# Patient Record
Sex: Female | Born: 1980 | Race: Black or African American | Hispanic: No | Marital: Married | Smoking: Former smoker
Health system: Southern US, Community
[De-identification: ages and names within clinical notes are randomized; demographics above are authoritative.]

## PROBLEM LIST (undated history)

## (undated) DIAGNOSIS — J40 Bronchitis, not specified as acute or chronic: Secondary | ICD-10-CM

## (undated) HISTORY — PX: ECTOPIC PREGNANCY SURGERY: SHX613

---

## 1999-08-12 ENCOUNTER — Emergency Department (HOSPITAL_COMMUNITY): Admission: EM | Admit: 1999-08-12 | Discharge: 1999-08-12 | Payer: Self-pay | Admitting: *Deleted

## 1999-08-12 ENCOUNTER — Encounter: Payer: Self-pay | Admitting: *Deleted

## 2000-06-28 ENCOUNTER — Emergency Department (HOSPITAL_COMMUNITY): Admission: EM | Admit: 2000-06-28 | Discharge: 2000-06-28 | Payer: Self-pay | Admitting: Emergency Medicine

## 2003-02-02 ENCOUNTER — Emergency Department (HOSPITAL_COMMUNITY): Admission: EM | Admit: 2003-02-02 | Discharge: 2003-02-02 | Payer: Self-pay | Admitting: Emergency Medicine

## 2003-03-03 ENCOUNTER — Encounter: Admission: RE | Admit: 2003-03-03 | Discharge: 2003-03-03 | Payer: Self-pay | Admitting: *Deleted

## 2003-03-05 ENCOUNTER — Ambulatory Visit (HOSPITAL_COMMUNITY): Admission: RE | Admit: 2003-03-05 | Discharge: 2003-03-05 | Payer: Self-pay | Admitting: *Deleted

## 2003-03-29 ENCOUNTER — Inpatient Hospital Stay (HOSPITAL_COMMUNITY): Admission: AD | Admit: 2003-03-29 | Discharge: 2003-03-29 | Payer: Self-pay | Admitting: Obstetrics and Gynecology

## 2003-04-08 ENCOUNTER — Inpatient Hospital Stay (HOSPITAL_COMMUNITY): Admission: AD | Admit: 2003-04-08 | Discharge: 2003-04-10 | Payer: Self-pay | Admitting: Family Medicine

## 2003-04-13 ENCOUNTER — Emergency Department (HOSPITAL_COMMUNITY): Admission: EM | Admit: 2003-04-13 | Discharge: 2003-04-14 | Payer: Self-pay | Admitting: *Deleted

## 2007-06-01 ENCOUNTER — Emergency Department (HOSPITAL_COMMUNITY): Admission: EM | Admit: 2007-06-01 | Discharge: 2007-06-01 | Payer: Self-pay | Admitting: Emergency Medicine

## 2009-01-14 ENCOUNTER — Emergency Department (HOSPITAL_COMMUNITY): Admission: EM | Admit: 2009-01-14 | Discharge: 2009-01-14 | Payer: Self-pay | Admitting: Emergency Medicine

## 2009-01-17 ENCOUNTER — Emergency Department (HOSPITAL_COMMUNITY): Admission: EM | Admit: 2009-01-17 | Discharge: 2009-01-17 | Payer: Self-pay | Admitting: Emergency Medicine

## 2009-05-02 ENCOUNTER — Ambulatory Visit: Payer: Self-pay | Admitting: Obstetrics & Gynecology

## 2009-05-02 ENCOUNTER — Inpatient Hospital Stay (HOSPITAL_COMMUNITY): Admission: AD | Admit: 2009-05-02 | Discharge: 2009-05-04 | Payer: Self-pay | Admitting: Obstetrics & Gynecology

## 2009-06-01 ENCOUNTER — Inpatient Hospital Stay (HOSPITAL_COMMUNITY): Admission: AD | Admit: 2009-06-01 | Discharge: 2009-06-01 | Payer: Self-pay | Admitting: Obstetrics

## 2009-08-12 ENCOUNTER — Ambulatory Visit (HOSPITAL_COMMUNITY): Admission: RE | Admit: 2009-08-12 | Discharge: 2009-08-12 | Payer: Self-pay | Admitting: Obstetrics

## 2009-11-07 ENCOUNTER — Inpatient Hospital Stay (HOSPITAL_COMMUNITY): Admission: AD | Admit: 2009-11-07 | Discharge: 2009-11-07 | Payer: Self-pay | Admitting: Obstetrics

## 2009-11-22 ENCOUNTER — Inpatient Hospital Stay (HOSPITAL_COMMUNITY): Admission: AD | Admit: 2009-11-22 | Discharge: 2009-11-22 | Payer: Self-pay | Admitting: Obstetrics

## 2009-11-25 ENCOUNTER — Inpatient Hospital Stay (HOSPITAL_COMMUNITY): Admission: AD | Admit: 2009-11-25 | Discharge: 2009-11-27 | Payer: Self-pay | Admitting: Obstetrics

## 2009-11-25 ENCOUNTER — Encounter (INDEPENDENT_AMBULATORY_CARE_PROVIDER_SITE_OTHER): Payer: Self-pay | Admitting: Obstetrics

## 2011-03-20 LAB — RPR: RPR Ser Ql: NONREACTIVE

## 2011-03-20 LAB — CBC
HCT: 31.4 % — ABNORMAL LOW (ref 36.0–46.0)
Hemoglobin: 8.8 g/dL — ABNORMAL LOW (ref 12.0–15.0)
MCHC: 31.2 g/dL (ref 30.0–36.0)
MCHC: 31.8 g/dL (ref 30.0–36.0)
MCV: 77.3 fL — ABNORMAL LOW (ref 78.0–100.0)
Platelets: 233 10*3/uL (ref 150–400)
Platelets: 261 10*3/uL (ref 150–400)
RDW: 16.7 % — ABNORMAL HIGH (ref 11.5–15.5)

## 2011-03-27 LAB — GC/CHLAMYDIA PROBE AMP, GENITAL
Chlamydia, DNA Probe: NEGATIVE
GC Probe Amp, Genital: NEGATIVE

## 2011-03-27 LAB — URINALYSIS, MICROSCOPIC ONLY
Bilirubin Urine: NEGATIVE
Glucose, UA: NEGATIVE mg/dL
Ketones, ur: NEGATIVE mg/dL
Nitrite: NEGATIVE
Protein, ur: NEGATIVE mg/dL
pH: 7 (ref 5.0–8.0)

## 2011-03-27 LAB — URINE MICROSCOPIC-ADD ON

## 2011-03-27 LAB — COMPREHENSIVE METABOLIC PANEL
ALT: 25 U/L (ref 0–35)
ALT: 42 U/L — ABNORMAL HIGH (ref 0–35)
AST: 47 U/L — ABNORMAL HIGH (ref 0–37)
Albumin: 3.9 g/dL (ref 3.5–5.2)
Alkaline Phosphatase: 37 U/L — ABNORMAL LOW (ref 39–117)
Alkaline Phosphatase: 54 U/L (ref 39–117)
CO2: 25 mEq/L (ref 19–32)
Chloride: 107 mEq/L (ref 96–112)
Chloride: 99 mEq/L (ref 96–112)
GFR calc Af Amer: >60 mL/min (ref >60)
GFR calc non Af Amer: >60 mL/min (ref >60)
Glucose, Bld: 70 mg/dL (ref 70–99)
Potassium: 3 mEq/L — ABNORMAL LOW (ref 3.5–5.1)
Potassium: 3.2 mEq/L — ABNORMAL LOW (ref 3.5–5.1)
Sodium: 135 mEq/L (ref 135–145)
Total Bilirubin: 0.6 mg/dL (ref 0.3–1.2)
Total Bilirubin: 0.6 mg/dL (ref 0.3–1.2)
Total Protein: 5.1 g/dL — ABNORMAL LOW (ref 6.0–8.3)

## 2011-03-27 LAB — TSH: TSH: 0.123 u[IU]/mL — ABNORMAL LOW (ref 0.350–4.500)

## 2011-03-27 LAB — URINALYSIS, ROUTINE W REFLEX MICROSCOPIC
Nitrite: NEGATIVE
Protein, ur: 100 mg/dL — AB
Specific Gravity, Urine: 1.02 (ref 1.005–1.030)
Urobilinogen, UA: 4 mg/dL — ABNORMAL HIGH (ref 0.0–1.0)

## 2011-03-27 LAB — CBC
Hemoglobin: 11.6 g/dL — ABNORMAL LOW (ref 12.0–15.0)
MCHC: 34.2 g/dL (ref 30.0–36.0)
RBC: 4 MIL/uL (ref 3.87–5.11)
WBC: 7.1 10*3/uL (ref 4.0–10.5)

## 2011-03-27 LAB — DIFFERENTIAL
Basophils Relative: 0 % (ref 0–1)
Lymphocytes Relative: 26 % (ref 12–46)
Lymphs Abs: 1.8 10*3/uL (ref 0.7–4.0)
Monocytes Absolute: 0.8 10*3/uL (ref 0.1–1.0)
Monocytes Relative: 11 % (ref 3–12)
Neutro Abs: 4.4 10*3/uL (ref 1.7–7.7)
Neutrophils Relative %: 63 % (ref 43–77)

## 2011-03-27 LAB — T3 UPTAKE: T3 Uptake Ratio: 29 % (ref 22.5–37.0)

## 2011-03-27 LAB — AMYLASE: Amylase: 169 U/L — ABNORMAL HIGH (ref 27–131)

## 2011-03-27 LAB — WET PREP, GENITAL

## 2011-03-27 LAB — T4, FREE: Free T4: 0.9 ng/dL (ref 0.80–1.80)

## 2011-04-02 LAB — URINE MICROSCOPIC-ADD ON

## 2011-04-02 LAB — CBC
RBC: 4.95 MIL/uL (ref 3.87–5.11)
WBC: 6.6 10*3/uL (ref 4.0–10.5)

## 2011-04-02 LAB — DIFFERENTIAL
Basophils Relative: 0 % (ref 0–1)
Lymphocytes Relative: 26 % (ref 12–46)
Lymphs Abs: 1.7 10*3/uL (ref 0.7–4.0)
Monocytes Relative: 13 % — ABNORMAL HIGH (ref 3–12)
Neutro Abs: 4 10*3/uL (ref 1.7–7.7)
Neutrophils Relative %: 61 % (ref 43–77)

## 2011-04-02 LAB — HEPATIC FUNCTION PANEL
AST: 43 U/L — ABNORMAL HIGH (ref 0–37)
Albumin: 4.2 g/dL (ref 3.5–5.2)
Alkaline Phosphatase: 56 U/L (ref 39–117)
Total Bilirubin: 1.2 mg/dL (ref 0.3–1.2)

## 2011-04-02 LAB — BASIC METABOLIC PANEL
Calcium: 9.4 mg/dL (ref 8.4–10.5)
Creatinine, Ser: 0.73 mg/dL (ref 0.4–1.2)
GFR calc Af Amer: >60 mL/min (ref >60)
GFR calc non Af Amer: >60 mL/min (ref >60)

## 2011-04-02 LAB — LIPASE, BLOOD: Lipase: 30 U/L (ref 11–59)

## 2011-04-02 LAB — URINALYSIS, ROUTINE W REFLEX MICROSCOPIC
Bilirubin Urine: NEGATIVE
Specific Gravity, Urine: 1.027 (ref 1.005–1.030)
Urobilinogen, UA: 1 mg/dL (ref 0.0–1.0)

## 2011-04-02 LAB — POCT PREGNANCY, URINE: Preg Test, Ur: NEGATIVE

## 2011-04-03 LAB — DIFFERENTIAL
Lymphocytes Relative: 12 % (ref 12–46)
Lymphs Abs: 0.8 10*3/uL (ref 0.7–4.0)
Neutrophils Relative %: 83 % — ABNORMAL HIGH (ref 43–77)

## 2011-04-03 LAB — POCT I-STAT, CHEM 8
Calcium, Ion: 1.07 mmol/L — ABNORMAL LOW (ref 1.12–1.32)
Chloride: 103 mEq/L (ref 96–112)
Glucose, Bld: 110 mg/dL — ABNORMAL HIGH (ref 70–99)
HCT: 43 % (ref 36.0–46.0)
Hemoglobin: 14.6 g/dL (ref 12.0–15.0)

## 2011-04-03 LAB — COMPREHENSIVE METABOLIC PANEL
ALT: 22 U/L (ref 0–35)
AST: 39 U/L — ABNORMAL HIGH (ref 0–37)
CO2: 24 mEq/L (ref 19–32)
Chloride: 104 mEq/L (ref 96–112)
GFR calc Af Amer: >60 mL/min (ref >60)
GFR calc non Af Amer: >60 mL/min (ref >60)
Potassium: 3.3 mEq/L — ABNORMAL LOW (ref 3.5–5.1)
Sodium: 136 mEq/L (ref 135–145)
Total Bilirubin: 0.7 mg/dL (ref 0.3–1.2)

## 2011-04-03 LAB — URINALYSIS, ROUTINE W REFLEX MICROSCOPIC
Bilirubin Urine: NEGATIVE
Hgb urine dipstick: NEGATIVE
Ketones, ur: NEGATIVE mg/dL
Nitrite: NEGATIVE
pH: 8 (ref 5.0–8.0)

## 2011-04-03 LAB — CBC
HCT: 39.4 % (ref 36.0–46.0)
Platelets: 165 10*3/uL (ref 150–400)
WBC: 6.3 10*3/uL (ref 4.0–10.5)

## 2011-04-03 LAB — POCT PREGNANCY, URINE: Preg Test, Ur: NEGATIVE

## 2011-05-01 NOTE — Discharge Summary (Signed)
NAMEYING, BLANKENHORN NO.:  [id number]   MEDICAL RECORD NO.:  [id number]          PATIENT TYPE:  INP   LOCATION:  9307                          FACILITY:  [facility]   PHYSICIAN:  Scheryl Darter, MD       DATE OF BIRTH:  April 06, 1981   DATE OF ADMISSION:  05/02/2009  DATE OF DISCHARGE:  05/04/2009                               DISCHARGE SUMMARY   DIAGNOSIS:  Intrauterine pregnancy at [redacted] weeks gestation with hyperemesis  gravidarum.   The patient is a 30 year old gravida 6, para 3, abortus 2, living 3,  last menstrual period February 24, 2009, presented at 9 weeks 4 days  gestation complaining of persistent nausea, vomiting, and abdominal  pain.   PAST MEDICAL HISTORY:  None.   PAST SURGICAL HISTORY:  Cesarean section.   PAST OB/GYN HISTORY:  Cesarean section and ectopic pregnancy.   SOCIAL HISTORY:  The patient denies alcohol or drug use.  She is a  current smoker, tobacco, and also marijuana.   ALLERGIES:  ERYTHROMYCIN.   MEDICATIONS:  None.   PHYSICAL EXAMINATION:  VITAL SIGNS:  Blood pressure 130/88, pulse 66,  temperature 97.7, and respirations 16.  ABDOMEN:  Nontender.   Her amylase was 169.  White count 7.1, hemoglobin 11.6, and platelet  count 195,000.   The patient was admitted with diagnosis of hyperemesis gravidarum.  She  received IV antiemetic therapy.  She received Reglan 10 mg IV q.6 h.,  Phenergan 12.5 mg IV q.3 h. p.r.n. nausea, and Zofran 8 mg IV q.8 h.  p.r.n. nausea.  She got improvement in her symptoms and on May 04, 2009,  she was tolerating p.o. fluids and food.  Repeat amylase was 168.  She  was discharged home with prescription for Pepcid 40 mg a day, Zofran 4  mg p.o. q.6-8 h. p.r.n. nausea, and Reglan 10 mg q.i.d. with meals and  at bedtime.  She was instructed to follow up at [facility].      Scheryl Darter, MD  Electronically Signed     JA/MEDQ  D:  05/20/2009  T:  05/21/2009  Job:  [id number]

## 2011-10-03 LAB — URINE MICROSCOPIC-ADD ON

## 2011-10-03 LAB — I-STAT 8, (EC8 V) (CONVERTED LAB)
BUN: 8
Bicarbonate: 22.5
Glucose, Bld: 168 — ABNORMAL HIGH
Sodium: 139
TCO2: 24
pCO2, Ven: 43.2 — ABNORMAL LOW
pH, Ven: 7.325 — ABNORMAL HIGH

## 2011-10-03 LAB — URINALYSIS, ROUTINE W REFLEX MICROSCOPIC
Glucose, UA: NEGATIVE
Ketones, ur: 40 — AB
Leukocytes, UA: NEGATIVE
Specific Gravity, Urine: 1.024
pH: 6

## 2011-10-03 LAB — POCT I-STAT CREATININE: Creatinine, Ser: 0.7

## 2012-02-21 ENCOUNTER — Encounter (HOSPITAL_COMMUNITY): Payer: Self-pay | Admitting: Emergency Medicine

## 2012-02-21 ENCOUNTER — Observation Stay (HOSPITAL_COMMUNITY)
Admission: EM | Admit: 2012-02-21 | Discharge: 2012-02-22 | Disposition: A | Discharge status: Home / Self Care | Payer: Medicaid Other | Attending: Emergency Medicine | Admitting: Emergency Medicine

## 2012-02-21 DIAGNOSIS — R197 Diarrhea, unspecified: Secondary | ICD-10-CM | POA: Insufficient documentation

## 2012-02-21 DIAGNOSIS — IMO0001 Reserved for inherently not codable concepts without codable children: Secondary | ICD-10-CM | POA: Insufficient documentation

## 2012-02-21 DIAGNOSIS — N390 Urinary tract infection, site not specified: Principal | ICD-10-CM | POA: Insufficient documentation

## 2012-02-21 DIAGNOSIS — R112 Nausea with vomiting, unspecified: Secondary | ICD-10-CM | POA: Insufficient documentation

## 2012-02-21 LAB — BASIC METABOLIC PANEL
CO2: 23 mEq/L (ref 19–32)
Chloride: 101 mEq/L (ref 96–112)
Creatinine, Ser: 0.65 mg/dL (ref 0.50–1.10)
GFR calc Af Amer: >90 mL/min (ref >90)
Potassium: 3.4 mEq/L — ABNORMAL LOW (ref 3.5–5.1)

## 2012-02-21 LAB — CBC
HCT: 40.8 % (ref 36.0–46.0)
Hemoglobin: 13.3 g/dL (ref 12.0–15.0)
MCHC: 32.6 g/dL (ref 30.0–36.0)
WBC: 8 10*3/uL (ref 4.0–10.5)

## 2012-02-21 LAB — DIFFERENTIAL
Basophils Absolute: 0 10*3/uL (ref 0.0–0.1)
Basophils Relative: 0 % (ref 0–1)
Lymphocytes Relative: 20 % (ref 12–46)
Monocytes Absolute: 0.7 10*3/uL (ref 0.1–1.0)
Monocytes Relative: 9 % (ref 3–12)
Neutro Abs: 5.7 10*3/uL (ref 1.7–7.7)
Neutrophils Relative %: 71 % (ref 43–77)

## 2012-02-21 LAB — LIPASE, BLOOD: Lipase: 53 U/L (ref 11–59)

## 2012-02-21 MED ORDER — ONDANSETRON HCL 4 MG/2ML IJ SOLN
4.0000 mg | Freq: Four times a day (QID) | INTRAMUSCULAR | Status: DC | PRN
  Administered 2012-02-21: 4 mg via INTRAVENOUS
  Filled 2012-02-21: qty 2

## 2012-02-21 MED ORDER — SODIUM CHLORIDE 0.9 % IV SOLN
INTRAVENOUS | Status: DC
  Administered 2012-02-22: 04:00:00 via INTRAVENOUS

## 2012-02-21 MED ORDER — PROMETHAZINE HCL 25 MG/ML IJ SOLN
25.0000 mg | INTRAMUSCULAR | Status: AC
  Administered 2012-02-22: 25 mg via INTRAVENOUS
  Filled 2012-02-21: qty 1

## 2012-02-21 MED ORDER — SODIUM CHLORIDE 0.9 % IV BOLUS (SEPSIS)
1000.0000 mL | Freq: Once | INTRAVENOUS | Status: AC
  Administered 2012-02-21: 1000 mL via INTRAVENOUS

## 2012-02-21 NOTE — ED Provider Notes (Signed)
History     CSN: [id number]  Arrival date & time 02/21/12  2249   First MD Initiated Contact with Patient 02/21/12 2319      Chief Complaint  Patient presents with  . Emesis    (Consider location/radiation/quality/duration/timing/severity/associated sxs/prior treatment) Patient is a 31 y.o. female presenting with vomiting. The history is provided by the patient.  Emesis  This is a new problem. The current episode started 12 to 24 hours ago. The problem occurs 5 to 10 times per day. The problem has not changed since onset.The emesis has an appearance of stomach contents. There has been no fever. Associated symptoms include diarrhea and myalgias. Pertinent negatives include no abdominal pain, no chills, no cough, no fever, no headaches, no sweats and no URI. Risk factors: 3 children at home all with the same symptoms of n/v/d and all diagnosed with a virus.   moderate in severity, no blood in emesis or stools, diarrhea resolved, still unable to tolerate anything by mouth. Worse with anything she tries to eat or drink, no known alleviating factors  History reviewed. No pertinent past medical history.  Past Surgical History  Procedure Date  . Ectopic pregnancy surgery     No family history on file.  History  Substance Use Topics  . Smoking status: Never Smoker   . Smokeless tobacco: Not on file  . Alcohol Use: No    OB History    Grav Para Term Preterm Abortions TAB SAB Ect Mult Living                  Review of Systems  Constitutional: Negative for fever and chills.  HENT: Negative for neck pain and neck stiffness.   Eyes: Negative for pain.  Respiratory: Negative for cough and shortness of breath.   Cardiovascular: Negative for chest pain.  Gastrointestinal: Positive for nausea, vomiting and diarrhea. Negative for abdominal pain and blood in stool.  Genitourinary: Negative for dysuria.  Musculoskeletal: Positive for myalgias. Negative for back pain.  Skin: Negative for  rash.  Neurological: Negative for headaches.  All other systems reviewed and are negative.    Allergies  Erythromycin  Home Medications  No current outpatient prescriptions on file.  BP 136/82  Pulse 63  Temp(Src) 98.4 F (36.9 C) (Oral)  Resp 18  SpO2 94%  Physical Exam  Constitutional: She is oriented to person, place, and time. She appears well-developed and well-nourished.  HENT:  Head: Normocephalic and atraumatic.       Mildly dry mm  Eyes: Conjunctivae and EOM are normal. Pupils are equal, round, and reactive to light.  Neck: Trachea normal. Neck supple. No thyromegaly present.  Cardiovascular: Normal rate, regular rhythm, S1 normal, S2 normal and normal pulses.     No systolic murmur is present   No diastolic murmur is present  Pulses:      Radial pulses are 2+ on the right side, and 2+ on the left side.  Pulmonary/Chest: Effort normal and breath sounds normal. She has no wheezes. She has no rhonchi. She has no rales. She exhibits no tenderness.  Abdominal: Soft. Normal appearance and bowel sounds are normal. There is no tenderness. There is no CVA tenderness and negative Murphy's sign.  Musculoskeletal:       BLE:s Calves nontender, no cords or erythema, negative Homans sign  Neurological: She is alert and oriented to person, place, and time. She has normal strength. No cranial nerve deficit or sensory deficit. GCS eye subscore is 4. GCS  verbal subscore is 5. GCS motor subscore is 6.  Skin: Skin is warm and dry. No rash noted. She is not diaphoretic.  Psychiatric: Her speech is normal.       Cooperative and appropriate    ED Course  Procedures (including critical care time)  Results for orders placed during the hospital encounter of 02/21/12  URINALYSIS, ROUTINE W REFLEX MICROSCOPIC      Component Value Range   Color, Urine YELLOW  YELLOW    APPearance CLOUDY (*) CLEAR    Specific Gravity, Urine 1.024  1.005 - 1.030    pH 6.5  5.0 - 8.0    Glucose, UA  NEGATIVE  NEGATIVE (mg/dL)   Hgb urine dipstick MODERATE (*) NEGATIVE    Bilirubin Urine NEGATIVE  NEGATIVE    Ketones, ur 15 (*) NEGATIVE (mg/dL)   Protein, ur 30 (*) NEGATIVE (mg/dL)   Urobilinogen, UA 1.0  0.0 - 1.0 (mg/dL)   Nitrite NEGATIVE  NEGATIVE    Leukocytes, UA MODERATE (*) NEGATIVE   CBC      Component Value Range   WBC 8.0  4.0 - 10.5 (K/uL)   RBC 5.03  3.87 - 5.11 (MIL/uL)   Hemoglobin 13.3  12.0 - 15.0 (g/dL)   HCT 19.1  47.8 - 29.5 (%)   MCV 81.1  78.0 - 100.0 (fL)   MCH 26.4  26.0 - 34.0 (pg)   MCHC 32.6  30.0 - 36.0 (g/dL)   RDW 62.1  30.8 - 65.7 (%)   Platelets 263  150 - 400 (K/uL)  DIFFERENTIAL      Component Value Range   Neutrophils Relative 71  43 - 77 (%)   Neutro Abs 5.7  1.7 - 7.7 (K/uL)   Lymphocytes Relative 20  12 - 46 (%)   Lymphs Abs 1.6  0.7 - 4.0 (K/uL)   Monocytes Relative 9  3 - 12 (%)   Monocytes Absolute 0.7  0.1 - 1.0 (K/uL)   Eosinophils Relative 0  0 - 5 (%)   Eosinophils Absolute 0.0  0.0 - 0.7 (K/uL)   Basophils Relative 0  0 - 1 (%)   Basophils Absolute 0.0  0.0 - 0.1 (K/uL)  BASIC METABOLIC PANEL      Component Value Range   Sodium 137  135 - 145 (mEq/L)   Potassium 3.4 (*) 3.5 - 5.1 (mEq/L)   Chloride 101  96 - 112 (mEq/L)   CO2 23  19 - 32 (mEq/L)   Glucose, Bld 144 (*) 70 - 99 (mg/dL)   BUN 13  6 - 23 (mg/dL)   Creatinine, Ser 8.46  0.50 - 1.10 (mg/dL)   Calcium 96.2  8.4 - 10.5 (mg/dL)   GFR calc non Af Amer >90  >90 (mL/min)   GFR calc Af Amer >90  >90 (mL/min)  LIPASE, BLOOD      Component Value Range   Lipase 53  11 - 59 (U/L)  POCT PREGNANCY, URINE      Component Value Range   Preg Test, Ur NEGATIVE  NEGATIVE   URINE MICROSCOPIC-ADD ON      Component Value Range   Squamous Epithelial / LPF FEW (*) RARE    WBC, UA TOO NUMEROUS TO COUNT  <3 (WBC/hpf)   RBC / HPF 7-10  <3 (RBC/hpf)   Bacteria, UA FEW (*) RARE    IV fluids. IV Zofran. Labs and UA reviewed. IV Rocephin provided for UTI with symptoms of  pyelonephritis.  At 3:45 AM patient  is requesting to be discharged home, states she's much better. Urine culture sent.  MDM   Vomiting diarrhea with UTI and possible pyelonephritis.  Medications provided as above and patient requesting be discharged home. Reliable historian verbalizes understanding all discharge and followup instructions and strict return precautions for any worsening condition.        Sunnie Nielsen, MD 02/22/12 [phone]

## 2012-02-21 NOTE — ED Notes (Signed)
Pt. Reports persistent vomitting/ diarrhea onset yesterday , denies abdominal pain , no fever or chills.

## 2012-02-22 LAB — URINALYSIS, ROUTINE W REFLEX MICROSCOPIC
Glucose, UA: NEGATIVE mg/dL
Nitrite: NEGATIVE
Specific Gravity, Urine: 1.024 (ref 1.005–1.030)
pH: 6.5 (ref 5.0–8.0)

## 2012-02-22 LAB — URINE MICROSCOPIC-ADD ON

## 2012-02-22 MED ORDER — MORPHINE SULFATE 2 MG/ML IJ SOLN
2.0000 mg | Freq: Once | INTRAMUSCULAR | Status: AC
  Administered 2012-02-22: 2 mg via INTRAVENOUS
  Filled 2012-02-22: qty 1

## 2012-02-22 MED ORDER — POTASSIUM CHLORIDE 10 MEQ/100ML IV SOLN
10.0000 meq | Freq: Once | INTRAVENOUS | Status: AC
  Administered 2012-02-22: 10 meq via INTRAVENOUS
  Filled 2012-02-22: qty 100

## 2012-02-22 MED ORDER — CEFPODOXIME PROXETIL 200 MG PO TABS: 200.0000 mg | ORAL_TABLET | Freq: Two times a day (BID) | ORAL | Status: AC

## 2012-02-22 MED ORDER — DEXTROSE 5 % IV SOLN
1.0000 g | Freq: Once | INTRAVENOUS | Status: AC
  Administered 2012-02-22: 1 g via INTRAVENOUS
  Filled 2012-02-22: qty 10

## 2012-02-22 MED ORDER — ONDANSETRON HCL 4 MG PO TABS: 4.0000 mg | ORAL_TABLET | Freq: Four times a day (QID) | ORAL | Status: AC

## 2012-02-22 NOTE — ED Notes (Signed)
Pt tolerated ice chips.  Gave pt a cup of water.  Pt stated she is feeling much better.

## 2012-02-22 NOTE — Discharge Instructions (Signed)
Pyelonephritis, Adult  Pyelonephritis is a kidney infection.  CAUSES  Two main causes of pyelonephritis are:  Bacteria traveling from the bladder to the kidney. This is a problem especially in pregnant women. The urine in the bladder can become filled with bacteria from multiple causes, including:  Inflammation of the prostate gland (prostatitis).  Sexual intercourse in females.  Bladder infection (cystitis).  Bacteria traveling from the bloodstream to the tissue part of the kidney.  Problems that may increase your risk of getting a kidney infection include:  Diabetes.  Kidney stones or bladder stones.  Cancer.  Catheters placed in the bladder.  Other abnormalities of the kidney or ureter.  SYMPTOMS  Abdominal pain.  Pain in the side or flank area.  Fever.  Chills.  Upset stomach.  Blood in the urine (dark urine).  Frequent urination.  Strong or persistent urge to urinate.  Burning or stinging when urinating.  DIAGNOSIS  Your caregiver may diagnose your kidney infection based on your symptoms. A urine sample may also be taken.  TREATMENT  In general, treatment depends on how severe the infection is.  If the infection is mild and caught early, your caregiver may treat you with oral antibiotics and send you home.  If the infection is more severe, the bacteria may have gotten into the bloodstream. This will require intravenous (IV) antibiotics and a hospital stay. Symptoms may include:  High fever.  Severe flank pain.  Shaking chills.  Even after a hospital stay, your caregiver may require you to be on oral antibiotics for a period of time.  Other treatments may be required depending upon the cause of the infection.  HOME CARE INSTRUCTIONS  Take your antibiotics as directed. Finish them even if you start to feel better.  Make an appointment to have your urine checked to make sure the infection is gone.  Drink enough fluids to keep your urine clear or pale yellow.  Take medicines  for the bladder if you have urgency and frequency of urination as directed by your caregiver.  SEEK IMMEDIATE MEDICAL CARE IF:  You have a fever.  You are unable to take your antibiotics or fluids.  You develop shaking chills.  You experience extreme weakness or fainting.  There is no improvement after 2 days of treatment.  MAKE SURE YOU:  Understand these instructions.  Will watch your condition.  Will get help right away if you are not doing well or get worse.    B.R.A.T. Diet Your doctor has recommended the B.R.A.T. diet for you or your child until the condition improves. This is often used to help control diarrhea and vomiting symptoms. If you or your child can tolerate clear liquids, you may have:  Bananas.   Rice.   Applesauce.   Toast (and other simple starches such as crackers, potatoes, noodles).  Be sure to avoid dairy products, meats, and fatty foods until symptoms are better. Fruit juices such as apple, grape, and prune juice can make diarrhea worse. Avoid these. Continue this diet for 2 days or as instructed by your caregiver. Document Released: 12/03/2005 Document Revised: 11/22/2011 Document Reviewed: 05/22/2007 [facility] Patient Information 2012 [place], [place].

## 2012-02-23 LAB — URINE CULTURE: Culture  Setup Time: 201303081028

## 2012-02-29 ENCOUNTER — Emergency Department (HOSPITAL_COMMUNITY)
Admission: EM | Admit: 2012-02-29 | Discharge: 2012-02-29 | Disposition: A | Discharge status: Home / Self Care | Payer: Medicaid Other | Attending: Emergency Medicine | Admitting: Emergency Medicine

## 2012-02-29 DIAGNOSIS — R112 Nausea with vomiting, unspecified: Secondary | ICD-10-CM | POA: Insufficient documentation

## 2012-02-29 DIAGNOSIS — R197 Diarrhea, unspecified: Secondary | ICD-10-CM | POA: Insufficient documentation

## 2012-02-29 DIAGNOSIS — N390 Urinary tract infection, site not specified: Secondary | ICD-10-CM

## 2012-02-29 DIAGNOSIS — Z79899 Other long term (current) drug therapy: Secondary | ICD-10-CM | POA: Insufficient documentation

## 2012-02-29 DIAGNOSIS — R109 Unspecified abdominal pain: Secondary | ICD-10-CM | POA: Insufficient documentation

## 2012-02-29 LAB — CBC
Platelets: 263 10*3/uL (ref 150–400)
RBC: 5.3 MIL/uL — ABNORMAL HIGH (ref 3.87–5.11)
WBC: 9.1 10*3/uL (ref 4.0–10.5)

## 2012-02-29 LAB — URINALYSIS, ROUTINE W REFLEX MICROSCOPIC
Nitrite: NEGATIVE
Specific Gravity, Urine: 1.034 — ABNORMAL HIGH (ref 1.005–1.030)
Urobilinogen, UA: 1 mg/dL (ref 0.0–1.0)

## 2012-02-29 LAB — COMPREHENSIVE METABOLIC PANEL
AST: 37 U/L (ref 0–37)
CO2: 28 mEq/L (ref 19–32)
Calcium: 10.1 mg/dL (ref 8.4–10.5)
Creatinine, Ser: 0.62 mg/dL (ref 0.50–1.10)
GFR calc Af Amer: >90 mL/min (ref >90)
GFR calc non Af Amer: >90 mL/min (ref >90)
Glucose, Bld: 118 mg/dL — ABNORMAL HIGH (ref 70–99)

## 2012-02-29 LAB — PREGNANCY, URINE: Preg Test, Ur: NEGATIVE

## 2012-02-29 LAB — URINE MICROSCOPIC-ADD ON

## 2012-02-29 MED ORDER — NITROFURANTOIN MONOHYD MACRO 100 MG PO CAPS: 100.0000 mg | ORAL_CAPSULE | Freq: Two times a day (BID) | ORAL | Status: AC

## 2012-02-29 MED ORDER — SODIUM CHLORIDE 0.9 % IV BOLUS (SEPSIS)
1000.0000 mL | Freq: Once | INTRAVENOUS | Status: AC
  Administered 2012-02-29: 1000 mL via INTRAVENOUS

## 2012-02-29 MED ORDER — ONDANSETRON HCL 4 MG/2ML IJ SOLN
4.0000 mg | Freq: Once | INTRAMUSCULAR | Status: AC
  Administered 2012-02-29: 4 mg via INTRAVENOUS
  Filled 2012-02-29: qty 2

## 2012-02-29 MED ORDER — LORAZEPAM 2 MG/ML IJ SOLN
1.0000 mg | Freq: Once | INTRAMUSCULAR | Status: AC
  Administered 2012-02-29: 1 mg via INTRAVENOUS
  Filled 2012-02-29: qty 1

## 2012-02-29 MED ORDER — PANTOPRAZOLE SODIUM 40 MG IV SOLR
40.0000 mg | Freq: Once | INTRAVENOUS | Status: AC
  Administered 2012-02-29: 40 mg via INTRAVENOUS
  Filled 2012-02-29: qty 40

## 2012-02-29 MED ORDER — ONDANSETRON HCL 8 MG PO TABS: 8.0000 mg | ORAL_TABLET | Freq: Three times a day (TID) | ORAL | Status: AC | PRN

## 2012-02-29 MED ORDER — HYDROMORPHONE HCL PF 1 MG/ML IJ SOLN
0.5000 mg | Freq: Once | INTRAMUSCULAR | Status: AC
  Administered 2012-02-29: 0.5 mg via INTRAVENOUS
  Filled 2012-02-29: qty 1

## 2012-02-29 MED ORDER — DEXTROSE 5 % IV SOLN
1.0000 g | INTRAVENOUS | Status: DC
  Administered 2012-02-29: 1 g via INTRAVENOUS
  Filled 2012-02-29: qty 10

## 2012-02-29 MED ORDER — POTASSIUM CHLORIDE CRYS ER 20 MEQ PO TBCR
20.0000 meq | EXTENDED_RELEASE_TABLET | Freq: Once | ORAL | Status: AC
  Administered 2012-02-29: 20 meq via ORAL
  Filled 2012-02-29: qty 1

## 2012-02-29 NOTE — ED Notes (Signed)
Pt vomiting at triage  Has not seen any one since last friday

## 2012-02-29 NOTE — ED Provider Notes (Signed)
History     CSN: [id number]  Arrival date & time 02/29/12  1325   First MD Initiated Contact with Patient 02/29/12 1459      Chief Complaint  Patient presents with  . Emesis    (Consider location/radiation/quality/duration/timing/severity/associated sxs/prior treatment) Patient is a 31 y.o. female presenting with vomiting. The history is provided by the patient.  Emesis  Associated symptoms include abdominal pain and diarrhea. Pertinent negatives include no chills, no cough, no fever and no headaches.  pt with nv for the past week. States mid abd crampy pain comes and goes. No constant or focal abd pain. No specific exacerbating or alleviating factors. States intermittent over past few years will have episode where develops abd cramping and nv for 6-7 days - denies specific prior diagnosis. No prior abd surgery. No hx gallstones, pud, or pancreatitis. No back or flank pain. No gu c/o. 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 No current vaginal discharge or bleeding. No fever or chills. No known ill contacts or bad food ingestion. States was in ED a few days ago and diagnosis w possible uti, says cant keep antibiotics down. No hx ibs, inflammatory bowel disease, or sprue. States is having occasional diarrhea stool, a couple times per day. No bloody emesis or diarrhea.   No past medical history on file.  Past Surgical History  Procedure Date  . Ectopic pregnancy surgery     No family history on file.  History  Substance Use Topics  . Smoking status: Never Smoker   . Smokeless tobacco: Not on file  . Alcohol Use: No    OB History    Grav Para Term Preterm Abortions TAB SAB Ect Mult Living                  Review of Systems  Constitutional: Negative for fever and chills.  HENT: Negative for neck pain.   Eyes: Negative for redness.  Respiratory: Negative for cough and shortness of breath.   Cardiovascular: Negative for  chest pain and leg swelling.  Gastrointestinal: Positive for vomiting, abdominal pain and diarrhea.  Genitourinary: Negative for flank pain.  Musculoskeletal: Negative for back pain.  Skin: Negative for rash.  Neurological: Negative for headaches.  Hematological: Does not bruise/bleed easily.  Psychiatric/Behavioral: Negative for confusion.    Allergies  Erythromycin  Home Medications   Current Outpatient Rx  Name Route Sig Dispense Refill  . CEFPODOXIME PROXETIL 200 MG PO TABS Oral Take 1 tablet (200 mg total) by mouth 2 (two) times daily. 14 tablet 0  . ONDANSETRON HCL 4 MG PO TABS Oral Take 1 tablet (4 mg total) by mouth every 6 (six) hours. 12 tablet 0    BP 149/91  Pulse 80  Resp 16  SpO2 98%  Physical Exam  Nursing note and vitals reviewed. Constitutional: She is oriented to person, place, and time. She appears well-developed and well-nourished. No distress.  HENT:  Head: Atraumatic.  Eyes: Conjunctivae are normal. No scleral icterus.  Neck: Neck supple. No tracheal deviation present.       No stiffness or rigidity  Cardiovascular: Normal rate, regular rhythm, normal heart sounds and intact distal pulses.  Exam reveals no gallop and no friction rub.   No murmur heard. Pulmonary/Chest: Effort normal and breath sounds normal. No respiratory distress.  Abdominal: Soft. Normal appearance and bowel sounds are normal. She exhibits no distension and no mass. There is no tenderness. There  is no rebound and no guarding.  Genitourinary:       No cva tenderness  Musculoskeletal: She exhibits no edema and no tenderness.  Neurological: She is alert and oriented to person, place, and time.  Skin: Skin is warm and dry. No rash noted.  Psychiatric: She has a normal mood and affect.    ED Course  Procedures (including critical care time)  Results for orders placed during the hospital encounter of 02/29/12  COMPREHENSIVE METABOLIC PANEL      Component Value Range   Sodium 137   135 - 145 (mEq/L)   Potassium 3.3 (*) 3.5 - 5.1 (mEq/L)   Chloride 97  96 - 112 (mEq/L)   CO2 28  19 - 32 (mEq/L)   Glucose, Bld 118 (*) 70 - 99 (mg/dL)   BUN 8  6 - 23 (mg/dL)   Creatinine, Ser 9.56  0.50 - 1.10 (mg/dL)   Calcium 21.3  8.4 - 10.5 (mg/dL)   Total Protein 7.7  6.0 - 8.3 (g/dL)   Albumin 4.3  3.5 - 5.2 (g/dL)   AST 37  0 - 37 (U/L)   ALT 24  0 - 35 (U/L)   Alkaline Phosphatase 73  39 - 117 (U/L)   Total Bilirubin 0.5  0.3 - 1.2 (mg/dL)   GFR calc non Af Amer >90  >90 (mL/min)   GFR calc Af Amer >90  >90 (mL/min)  LIPASE, BLOOD      Component Value Range   Lipase 41  11 - 59 (U/L)  CBC      Component Value Range   WBC 9.1  4.0 - 10.5 (K/uL)   RBC 5.30 (*) 3.87 - 5.11 (MIL/uL)   Hemoglobin 14.4  12.0 - 15.0 (g/dL)   HCT 08.6  57.8 - 46.9 (%)   MCV 80.2  78.0 - 100.0 (fL)   MCH 27.2  26.0 - 34.0 (pg)   MCHC 33.9  30.0 - 36.0 (g/dL)   RDW 62.9  52.8 - 41.3 (%)   Platelets 263  150 - 400 (K/uL)  PREGNANCY, URINE      Component Value Range   Preg Test, Ur NEGATIVE  NEGATIVE   URINALYSIS, ROUTINE W REFLEX MICROSCOPIC      Component Value Range   Color, Urine AMBER (*) YELLOW    APPearance CLEAR  CLEAR    Specific Gravity, Urine 1.034 (*) 1.005 - 1.030    pH 6.5  5.0 - 8.0    Glucose, UA NEGATIVE  NEGATIVE (mg/dL)   Hgb urine dipstick SMALL (*) NEGATIVE    Bilirubin Urine SMALL (*) NEGATIVE    Ketones, ur 15 (*) NEGATIVE (mg/dL)   Protein, ur 30 (*) NEGATIVE (mg/dL)   Urobilinogen, UA 1.0  0.0 - 1.0 (mg/dL)   Nitrite NEGATIVE  NEGATIVE    Leukocytes, UA MODERATE (*) NEGATIVE   URINE MICROSCOPIC-ADD ON      Component Value Range   WBC, UA 11-20  <3 (WBC/hpf)   RBC / HPF 0-2  <3 (RBC/hpf)   Bacteria, UA RARE  RARE    Urine-Other MUCOUS PRESENT         MDM  On first entering room pt w very angry, aggressive, hostile demeanor.  Pt states I am filing complaint against this place', expressing displeasure with wait and no iv fluids as of yet. I attempted  service recovery, apologizing for wait, reassured that we would be starting iv fluids very soon, as well as giving her medication for symptom  relief. Pt remains very hostile. Ask nurse to start ordered meds, iv fluids as soon as able.   Reviewed recent urine culture, negative. Mild epigastric pain. Dilaudid .5 mg iv. protonix iv.   2nd liter ns. Pt improved. Tolerating po. abd soft nt. kcl po.   Cath ua w 11-20 wbc, will cx and rx.  Rocephin iv. Will also give rx zofran for home.   pcp f/u 1-2 days, return to er if worse, increased pain, persistent nv or fevers.      Suzi Roots, MD 02/29/12 [phone]

## 2012-02-29 NOTE — Discharge Instructions (Signed)
We hope you feel better.   Rest. Drink plenty of fluids. Take zofran as need for nausea. The lab work shows a possible urine infection - take antibiotic as prescribed. A urine culture was sent the results of which will be back in 1-2 days  - follow up with primary care doctor for recheck of symptoms in 1-2 days - also have primary care doctor follow up on those results in the next 1-2 days.   Also from the lab work, your potassium level is slightly low (3.3) - eat plenty of fruits and vegetables, and follow up with primary care doctor in 1 week.  Return to ER right away if worse, severe abdominal pain, persistent vomiting, high fevers, other concern.   You were given pain medication in the ER -  No driving for the next 6 hours.     Abdominal Pain Abdominal pain can be caused by many things. Your caregiver decides the seriousness of your pain by an examination and possibly blood tests and X-rays. Many cases can be observed and treated at home. Most abdominal pain is not caused by a disease and will probably improve without treatment. However, in many cases, more time must pass before a clear cause of the pain can be found. Before that point, it may not be known if you need more testing, or if hospitalization or surgery is needed. HOME CARE INSTRUCTIONS   Do not take laxatives unless directed by your caregiver.   Take pain medicine only as directed by your caregiver.   Only take over-the-counter or prescription medicines for pain, discomfort, or fever as directed by your caregiver.   Try a clear liquid diet (broth, tea, or water) for as long as directed by your caregiver. Slowly move to a bland diet as tolerated.  SEEK IMMEDIATE MEDICAL CARE IF:   The pain does not go away.   You have a fever.   You keep throwing up (vomiting).   The pain is felt only in portions of the abdomen. Pain in the right side could possibly be appendicitis. In an adult, pain in the left lower portion of the  abdomen could be colitis or diverticulitis.   You pass bloody or black tarry stools.  MAKE SURE YOU:   Understand these instructions.   Will watch your condition.   Will get help right away if you are not doing well or get worse.  Document Released: 09/12/2005 Document Revised: 11/22/2011 Document Reviewed: 07/21/2008 [facility] Patient Information 2012 [place], [place].      Nausea and Vomiting Nausea is a sick feeling that often comes before throwing up (vomiting). Vomiting is a reflex where stomach contents come out of your mouth. Vomiting can cause severe loss of body fluids (dehydration). Children and elderly adults can become dehydrated quickly, especially if they also have diarrhea. Nausea and vomiting are symptoms of a condition or disease. It is important to find the cause of your symptoms. CAUSES   Direct irritation of the stomach lining. This irritation can result from increased acid production (gastroesophageal reflux disease), infection, food poisoning, taking certain medicines (such as nonsteroidal anti-inflammatory drugs), alcohol use, or tobacco use.   Signals from the brain.These signals could be caused by a headache, heat exposure, an inner ear disturbance, increased pressure in the brain from injury, infection, a tumor, or a concussion, pain, emotional stimulus, or metabolic problems.   An obstruction in the gastrointestinal tract (bowel obstruction).   Illnesses such as diabetes, hepatitis, gallbladder problems, appendicitis, kidney problems,  cancer, sepsis, atypical symptoms of a heart attack, or eating disorders.   Medical treatments such as chemotherapy and radiation.   Receiving medicine that makes you sleep (general anesthetic) during surgery.  DIAGNOSIS Your caregiver may ask for tests to be done if the problems do not improve after a few days. Tests may also be done if symptoms are severe or if the reason for the nausea and vomiting is not clear. Tests may  include:  Urine tests.   Blood tests.   Stool tests.   Cultures (to look for evidence of infection).   X-rays or other imaging studies.  Test results can help your caregiver make decisions about treatment or the need for additional tests. TREATMENT You need to stay well hydrated. Drink frequently but in small amounts.You may wish to drink water, sports drinks, clear broth, or eat frozen ice pops or gelatin dessert to help stay hydrated.When you eat, eating slowly may help prevent nausea.There are also some antinausea medicines that may help prevent nausea. HOME CARE INSTRUCTIONS   Take all medicine as directed by your caregiver.   If you do not have an appetite, do not force yourself to eat. However, you must continue to drink fluids.   If you have an appetite, eat a normal diet unless your caregiver tells you differently.   Eat a variety of complex carbohydrates (rice, wheat, potatoes, bread), lean meats, yogurt, fruits, and vegetables.   Avoid high-fat foods because they are more difficult to digest.   Drink enough water and fluids to keep your urine clear or pale yellow.   If you are dehydrated, ask your caregiver for specific rehydration instructions. Signs of dehydration may include:   Severe thirst.   Dry lips and mouth.   Dizziness.   Dark urine.   Decreasing urine frequency and amount.   Confusion.   Rapid breathing or pulse.  SEEK IMMEDIATE MEDICAL CARE IF:   You have blood or brown flecks (like coffee grounds) in your vomit.   You have black or bloody stools.   You have a severe headache or stiff neck.   You are confused.   You have severe abdominal pain.   You have chest pain or trouble breathing.   You do not urinate at least once every 8 hours.   You develop cold or clammy skin.   You continue to vomit for longer than 24 to 48 hours.   You have a fever.  MAKE SURE YOU:   Understand these instructions.   Will watch your condition.    Will get help right away if you are not doing well or get worse.  Document Released: 12/03/2005 Document Revised: 11/22/2011 Document Reviewed: 05/02/2011 [facility] Patient Information 2012 [place], [place].     Urinary Tract Infection Infections of the urinary tract can start in several places. A bladder infection (cystitis), a kidney infection (pyelonephritis), and a prostate infection (prostatitis) are different types of urinary tract infections (UTIs). They usually get better if treated with medicines (antibiotics) that kill germs. Take all the medicine until it is gone. You or your child may feel better in a few days, but TAKE ALL MEDICINE or the infection may not respond and may become more difficult to treat. HOME CARE INSTRUCTIONS   Drink enough water and fluids to keep the urine clear or pale yellow. Cranberry juice is especially recommended, in addition to large amounts of water.   Avoid caffeine, tea, and carbonated beverages. They tend to irritate the bladder.  Alcohol may irritate the prostate.   Only take over-the-counter or prescription medicines for pain, discomfort, or fever as directed by your caregiver.  To prevent further infections:  Empty the bladder often. Avoid holding urine for long periods of time.   After a bowel movement, women should cleanse from front to back. Use each tissue only once.   Empty the bladder before and after sexual intercourse.  FINDING OUT THE RESULTS OF YOUR TEST Not all test results are available during your visit. If your or your child's test results are not back during the visit, make an appointment with your caregiver to find out the results. Do not assume everything is normal if you have not heard from your caregiver or the medical facility. It is important for you to follow up on all test results. SEEK MEDICAL CARE IF:   There is back pain.   Your baby is older than 3 months with a rectal temperature of 100.5 F (38.1 C) or  higher for more than 1 day.   Your or your child's problems (symptoms) are no better in 3 days. Return sooner if you or your child is getting worse.  SEEK IMMEDIATE MEDICAL CARE IF:   There is severe back pain or lower abdominal pain.   You or your child develops chills.   You have a fever.   Your baby is older than 3 months with a rectal temperature of 102 F (38.9 C) or higher.   Your baby is 81 months old or younger with a rectal temperature of 100.4 F (38 C) or higher.   There is nausea or vomiting.   There is continued burning or discomfort with urination.  MAKE SURE YOU:   Understand these instructions.   Will watch your condition.   Will get help right away if you are not doing well or get worse.  Document Released: 09/12/2005 Document Revised: 11/22/2011 Document Reviewed: 04/17/2007 [facility] Patient Information 2012 [place], [place].      Hypokalemia Hypokalemia means a low potassium level in the blood.Potassium is an electrolyte that helps regulate the amount of fluid in the body. It also stimulates muscle contraction and maintains a stable acid-base balance.Most of the body's potassium is inside of cells, and only a very small amount is in the blood. Because the amount in the blood is so small, minor changes can have big effects. PREPARATION FOR TEST Testing for potassium requires taking a blood sample taken by needle from a vein in the arm. The skin is cleaned thoroughly before the sample is drawn. There is no other special preparation needed. NORMAL VALUES Potassium levels below 3.5 mEq/L are abnormally low. Levels above 5.1 mEq/L are abnormally high. Ranges for normal findings may vary among different laboratories and hospitals. You should always check with your doctor after having lab work or other tests done to discuss the meaning of your test results and whether your values are considered within normal limits. MEANING OF TEST  Your caregiver will go over  the test results with you and discuss the importance and meaning of your results, as well as treatment options and the need for additional tests, if necessary. A potassium level is frequently part of a routine medical exam. It is usually included as part of a whole "panel" of tests for several blood salts (such as Sodium and Chloride). It may be done as part of follow-up when a low potassium level was found in the past or other blood salts are suspected of being  out of balance. A low potassium level might be suspected if you have one or more of the following:  Symptoms of weakness.   Abnormal heart rhythms.   High blood pressure and are taking medication to control this, especially water pills (diuretics).   Kidney disease that can affect your potassium level .   Diabetes requiring the use of insulin. The potassium may fall after taking insulin, especially if the diabetes had been out of control for a while.   A condition requiring the use of cortisone-type medication or certain types of antibiotics.   Vomiting and/or diarrhea for more than a day or two.   A stomach or intestinal condition that may not permit appropriate absorption of potassium.   Fainting episodes.   Mental confusion.  OBTAINING TEST RESULTS It is your responsibility to obtain your test results. Ask the lab or department performing the test when and how you will get your results.  Please contact your caregiver directly if you have not received the results within one week. At that time, ask if there is anything different or new you should be doing in relation to the results. TREATMENT Hypokalemia can be treated with potassium supplements taken by mouth and/or adjustments in your current medications. A diet high in potassium is also helpful. Foods with high potassium content are:  Peas, lentils, lima beans, nuts, and dried fruit.   Whole grain and bran cereals and breads.   Fresh fruit, vegetables (bananas,  cantaloupe, grapefruit, oranges, tomatoes, honeydew melons, potatoes).   Orange and tomato juices.   Meats. If potassium supplement has been prescribed for you today or your medications have been adjusted, see your personal caregiver in time02 for a re-check.  SEEK MEDICAL CARE IF:  There is a feeling of worsening weakness.   You experience repeated chest palpitations.   You are diabetic and having difficulty keeping your blood sugars in the normal range.   You are experiencing vomiting and/or diarrhea.   You are having difficulty with any of your regular medications.  SEEK IMMEDIATE MEDICAL CARE IF:  You experience chest pain, shortness of breath, or episodes of dizziness.   You have been having vomiting or diarrhea for more than 2 days.   You have a fainting episode.  MAKE SURE YOU:   Understand these instructions.   Will watch your condition.   Will get help right away if you are not doing well or get worse.  Document Released: 12/03/2005 Document Revised: 11/22/2011 Document Reviewed: 11/13/2008 [facility] Patient Information 2012 [place], [place].

## 2012-02-29 NOTE — ED Notes (Signed)
Pt was received to [facility] with c/o vomiting x 1 week with diarrhea. Pt was diagnosed with UTI last Friday. She has been vomiting she is unable to tolerate her medicines nor anything by mouth. Will monitor the patient

## 2012-02-29 NOTE — ED Notes (Signed)
States was seen  Last Friday and was told she had an infection but did  Not  Get meds filled due to vomiting she states

## 2012-03-01 LAB — URINE CULTURE
Colony Count: NO GROWTH
Culture: NO GROWTH

## 2012-11-25 ENCOUNTER — Encounter (HOSPITAL_COMMUNITY): Payer: Self-pay | Admitting: Emergency Medicine

## 2012-11-25 ENCOUNTER — Emergency Department (HOSPITAL_COMMUNITY)
Admission: EM | Admit: 2012-11-25 | Discharge: 2012-11-25 | Disposition: A | Discharge status: Home / Self Care | Payer: Medicaid Other | Attending: Emergency Medicine | Admitting: Emergency Medicine

## 2012-11-25 DIAGNOSIS — S139XXA Sprain of joints and ligaments of unspecified parts of neck, initial encounter: Secondary | ICD-10-CM | POA: Insufficient documentation

## 2012-11-25 DIAGNOSIS — Y939 Activity, unspecified: Secondary | ICD-10-CM | POA: Insufficient documentation

## 2012-11-25 DIAGNOSIS — Y92410 Unspecified street and highway as the place of occurrence of the external cause: Secondary | ICD-10-CM | POA: Insufficient documentation

## 2012-11-25 DIAGNOSIS — S161XXA Strain of muscle, fascia and tendon at neck level, initial encounter: Secondary | ICD-10-CM

## 2012-11-25 DIAGNOSIS — F17200 Nicotine dependence, unspecified, uncomplicated: Secondary | ICD-10-CM | POA: Insufficient documentation

## 2012-11-25 MED ORDER — METHOCARBAMOL 500 MG PO TABS: 500.0000 mg | ORAL_TABLET | Freq: Two times a day (BID) | ORAL | Status: DC

## 2012-11-25 MED ORDER — METHOCARBAMOL 500 MG PO TABS
500.0000 mg | ORAL_TABLET | Freq: Once | ORAL | Status: AC
  Administered 2012-11-25: 500 mg via ORAL
  Filled 2012-11-25: qty 1

## 2012-11-25 MED ORDER — IBUPROFEN 400 MG PO TABS
600.0000 mg | ORAL_TABLET | Freq: Once | ORAL | Status: AC
  Administered 2012-11-25: 600 mg via ORAL
  Filled 2012-11-25: qty 1

## 2012-11-25 MED ORDER — IBUPROFEN 600 MG PO TABS
600.0000 mg | ORAL_TABLET | Freq: Four times a day (QID) | ORAL | Status: DC | PRN
Start: 2012-11-25 — End: 2013-10-11
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
## 2012-11-26 ENCOUNTER — Encounter (HOSPITAL_COMMUNITY): Payer: Self-pay | Admitting: Family Medicine

## 2012-11-26 ENCOUNTER — Emergency Department (HOSPITAL_COMMUNITY)
Admission: EM | Admit: 2012-11-26 | Discharge: 2012-11-26 | Disposition: A | Discharge status: Home / Self Care | Payer: Medicaid Other | Attending: Emergency Medicine | Admitting: Emergency Medicine

## 2012-11-26 DIAGNOSIS — Y9389 Activity, other specified: Secondary | ICD-10-CM | POA: Insufficient documentation

## 2012-11-26 DIAGNOSIS — Z79899 Other long term (current) drug therapy: Secondary | ICD-10-CM | POA: Insufficient documentation

## 2012-11-26 DIAGNOSIS — S61209A Unspecified open wound of unspecified finger without damage to nail, initial encounter: Secondary | ICD-10-CM | POA: Insufficient documentation

## 2012-11-26 DIAGNOSIS — W268XXA Contact with other sharp object(s), not elsewhere classified, initial encounter: Secondary | ICD-10-CM | POA: Insufficient documentation

## 2012-11-26 DIAGNOSIS — Y929 Unspecified place or not applicable: Secondary | ICD-10-CM | POA: Insufficient documentation

## 2012-11-26 DIAGNOSIS — F17200 Nicotine dependence, unspecified, uncomplicated: Secondary | ICD-10-CM | POA: Insufficient documentation

## 2012-11-26 DIAGNOSIS — Z8742 Personal history of other diseases of the female genital tract: Secondary | ICD-10-CM | POA: Insufficient documentation
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
## 2012-11-26 NOTE — ED Notes (Signed)
Pt is here with right ring finger laceration from can opener.  Bleeding continues to bleed

## 2013-10-11 ENCOUNTER — Emergency Department (HOSPITAL_COMMUNITY)
Admission: EM | Admit: 2013-10-11 | Discharge: 2013-10-11 | Disposition: A | Discharge status: Home / Self Care | Payer: Medicaid Other | Attending: Emergency Medicine | Admitting: Emergency Medicine

## 2013-10-11 ENCOUNTER — Encounter (HOSPITAL_COMMUNITY): Payer: Self-pay | Admitting: Emergency Medicine

## 2013-10-11 DIAGNOSIS — F17200 Nicotine dependence, unspecified, uncomplicated: Secondary | ICD-10-CM | POA: Insufficient documentation

## 2013-10-11 DIAGNOSIS — R109 Unspecified abdominal pain: Secondary | ICD-10-CM

## 2013-10-11 DIAGNOSIS — R1110 Vomiting, unspecified: Secondary | ICD-10-CM

## 2013-10-11 DIAGNOSIS — R1084 Generalized abdominal pain: Secondary | ICD-10-CM | POA: Insufficient documentation

## 2013-10-11 DIAGNOSIS — Z3202 Encounter for pregnancy test, result negative: Secondary | ICD-10-CM | POA: Insufficient documentation

## 2013-10-11 DIAGNOSIS — Z79899 Other long term (current) drug therapy: Secondary | ICD-10-CM | POA: Insufficient documentation

## 2013-10-11 LAB — COMPREHENSIVE METABOLIC PANEL WITH GFR
AST: 58 U/L — ABNORMAL HIGH (ref 0–37)
Albumin: 4.5 g/dL (ref 3.5–5.2)
BUN: 9 mg/dL (ref 6–23)
Calcium: 9.4 mg/dL (ref 8.4–10.5)
Creatinine, Ser: 0.56 mg/dL (ref 0.50–1.10)
GFR calc non Af Amer: >90 mL/min (ref >90)

## 2013-10-11 LAB — LIPASE, BLOOD: Lipase: 17 U/L (ref 11–59)

## 2013-10-11 LAB — POCT PREGNANCY, URINE: Preg Test, Ur: NEGATIVE

## 2013-10-11 LAB — CBC WITH DIFFERENTIAL/PLATELET
Basophils Absolute: 0 K/uL (ref 0.0–0.1)
Basophils Relative: 0 % (ref 0–1)
Eosinophils Absolute: 0 10*3/uL (ref 0.0–0.7)
Eosinophils Relative: 0 % (ref 0–5)
HCT: 40.1 % (ref 36.0–46.0)
Hemoglobin: 13.5 g/dL (ref 12.0–15.0)
Lymphocytes Relative: 9 % — ABNORMAL LOW (ref 12–46)
Lymphs Abs: 0.8 10*3/uL (ref 0.7–4.0)
MCH: 27.6 pg (ref 26.0–34.0)
MCHC: 33.7 g/dL (ref 30.0–36.0)
MCV: 82 fL (ref 78.0–100.0)
Monocytes Absolute: 0.5 K/uL (ref 0.1–1.0)
Monocytes Relative: 5 % (ref 3–12)
Neutro Abs: 7.8 10*3/uL — ABNORMAL HIGH (ref 1.7–7.7)
Neutrophils Relative %: 86 % — ABNORMAL HIGH (ref 43–77)
Platelets: 222 10*3/uL (ref 150–400)
RBC: 4.89 MIL/uL (ref 3.87–5.11)
RDW: 14.1 % (ref 11.5–15.5)
WBC: 9.1 10*3/uL (ref 4.0–10.5)

## 2013-10-11 LAB — URINALYSIS, ROUTINE W REFLEX MICROSCOPIC
Bilirubin Urine: NEGATIVE
Glucose, UA: NEGATIVE mg/dL
Ketones, ur: 40 mg/dL — AB
Leukocytes, UA: NEGATIVE
Nitrite: NEGATIVE
Protein, ur: 30 mg/dL — AB
Specific Gravity, Urine: 1.026 (ref 1.005–1.030)
Urobilinogen, UA: 0.2 mg/dL (ref 0.0–1.0)
pH: 7 (ref 5.0–8.0)

## 2013-10-11 LAB — URINE MICROSCOPIC-ADD ON

## 2013-10-11 LAB — COMPREHENSIVE METABOLIC PANEL
ALT: 40 U/L — ABNORMAL HIGH (ref 0–35)
Alkaline Phosphatase: 67 U/L (ref 39–117)
CO2: 24 mEq/L (ref 19–32)
Chloride: 105 mEq/L (ref 96–112)
GFR calc Af Amer: >90 mL/min (ref >90)
Glucose, Bld: 150 mg/dL — ABNORMAL HIGH (ref 70–99)
Potassium: 3.4 mEq/L — ABNORMAL LOW (ref 3.5–5.1)
Sodium: 142 mEq/L (ref 135–145)
Total Bilirubin: 0.3 mg/dL (ref 0.3–1.2)
Total Protein: 7.7 g/dL (ref 6.0–8.3)

## 2013-10-11 MED ORDER — ONDANSETRON HCL 4 MG/2ML IJ SOLN
4.0000 mg | Freq: Once | INTRAMUSCULAR | Status: AC
  Administered 2013-10-11: 4 mg via INTRAVENOUS
  Filled 2013-10-11: qty 2

## 2013-10-11 MED ORDER — SODIUM CHLORIDE 0.9 % IV BOLUS (SEPSIS)
1000.0000 mL | Freq: Once | INTRAVENOUS | Status: AC
  Administered 2013-10-11: 1000 mL via INTRAVENOUS

## 2013-10-11 MED ORDER — KETOROLAC TROMETHAMINE 15 MG/ML IJ SOLN
15.0000 mg | Freq: Once | INTRAMUSCULAR | Status: AC
  Administered 2013-10-11: 15 mg via INTRAVENOUS
  Filled 2013-10-11: qty 1

## 2013-10-11 NOTE — ED Provider Notes (Signed)
I saw and evaluated the patient, reviewed the resident's note and I agree with the findings and plan.  EKG Interpretation   None       Pt drank too much alcohol yesterday, has had repetitive emesis since.  Abd is soft, no rebound tenderness.  Pt has had similar in the past.  Pt feels dehydrated, K+ is slightly low from emesis.  Will give IVF's to rehydrate.  Pt can be discharged afterwards and if tolerating PO's.    Gavin Pound. Oletta Lamas, MD 10/15/13 2136

## 2013-10-11 NOTE — ED Notes (Addendum)
Pt states, "I drank some liquor last night and I only ate one time yesterday.  And usually I feel better after I throw up but I didn't."  Stated she vomited once at 2am and then continuously from 5am to now.  Also informed me that she knows her body and needs and IV.

## 2013-10-11 NOTE — ED Provider Notes (Signed)
CSN: [id number]     Arrival date & time 10/11/13  1226 History   First MD Initiated Contact with Patient 10/11/13 1514     Chief Complaint  Patient presents with  . Emesis    The history is provided by the patient. No language interpreter was used.    Ms. Hoppel is a 32 y.o. African American female with no past medical history who comes to the emergency department today with emesis for the past 12 hours. She was reportedly drinking last night and began vomiting. She's vomited approximately 20 times since then developed some generalized abdominal pain. Trachea abdominal pain at 4/10. She has not had any fevers, chills, diarrhea, constipation, dysuria, frequency, urgency, vaginal bleeding, or vaginal discharge. She denies history of abdominal surgery or pancreatitis. She has been to the emergency department several times for similar complaints. She states that usually she just needs IV fluids and IV Zofran she feels better. She did have a small amount of blood into her episodes of emesis. She did not have any clots. The bleeding has improved since then.   History reviewed. No pertinent past medical history. Past Surgical History  Procedure Laterality Date  . Ectopic pregnancy surgery    . Ectopic pregnancy surgery     History reviewed. No pertinent family history. History  Substance Use Topics  . Smoking status: Current Every Day Smoker  . Smokeless tobacco: Not on file  . Alcohol Use: Yes   OB History   Grav Para Term Preterm Abortions TAB SAB Ect Mult Living                 Review of Systems  Constitutional: Negative for fever and chills.  Respiratory: Negative for cough and shortness of breath.   Gastrointestinal: Positive for nausea, vomiting and abdominal pain. Negative for diarrhea and constipation.  Genitourinary: Negative for dysuria, urgency, frequency, decreased urine volume, vaginal bleeding, vaginal discharge and vaginal pain.  All other systems reviewed and are  negative.    Allergies  Erythromycin  Home Medications   Current Outpatient Rx  Name  Route  Sig  Dispense  Refill  . Ascorbic Acid (VITAMIN C PO)   Oral   Take 1 tablet by mouth daily.         . Cyanocobalamin (VITAMIN B 12 PO)   Oral   Take 1 tablet by mouth daily.          BP 135/67  Pulse 57  Temp(Src) 98.4 F (36.9 C) (Oral)  Resp 20  SpO2 100% Physical Exam  Nursing note and vitals reviewed. Constitutional: She is oriented to person, place, and time. She appears well-developed and well-nourished. No distress.  HENT:  Head: Normocephalic and atraumatic.  Eyes: Pupils are equal, round, and reactive to light.  Neck: Normal range of motion.  Cardiovascular: Normal rate, regular rhythm, normal heart sounds and intact distal pulses.   Pulmonary/Chest: Effort normal. No respiratory distress. She has no wheezes. She exhibits no tenderness.  Abdominal: Soft. Bowel sounds are normal. She exhibits no distension. There is generalized tenderness. There is no rigidity, no rebound and no guarding.  Neurological: She is alert and oriented to person, place, and time. She has normal strength. No cranial nerve deficit or sensory deficit. She exhibits normal muscle tone. Coordination and gait normal.  Skin: Skin is warm and dry.    ED Course  Procedures  Labs Review Labs Reviewed  CBC WITH DIFFERENTIAL - Abnormal; Notable for the following:    Neutrophils  Relative % 86 (*)    Neutro Abs 7.8 (*)    Lymphocytes Relative 9 (*)    All other components within normal limits  COMPREHENSIVE METABOLIC PANEL - Abnormal; Notable for the following:    Potassium 3.4 (*)    Glucose, Bld 150 (*)    AST 58 (*)    ALT 40 (*)    All other components within normal limits  URINALYSIS, ROUTINE W REFLEX MICROSCOPIC - Abnormal; Notable for the following:    Hgb urine dipstick SMALL (*)    Ketones, ur 40 (*)    Protein, ur 30 (*)    All other components within normal limits  URINE  MICROSCOPIC-ADD ON - Abnormal; Notable for the following:    Squamous Epithelial / LPF FEW (*)    All other components within normal limits  LIPASE, BLOOD  POCT PREGNANCY, URINE   Imaging Review No results found.  EKG Interpretation   None       MDM   Ms. Huertas is a 32 year old African American female who comes to the emergency department today with emesis and abdominal pain. Physical exam as above. With benign abdominal exam shows not felt to require imaging of the abdomen. Initial workup included a urine pregnancy, UA, CBC, CMP, and a lipase. Lipase is 17. CMP had potassium of 3.4, AST of 58, ALT of 40 otherwise unremarkable. CBC was unremarkable. UA was unremarkable. Urine pregnancy was negative. Doubt appendicitis, acute cholecystitis, pancreatitis, urinary tract infection, or ectopic pregnancy. Ms. Klinker was treated with a liter of normal saline and 4 mg of Zofran emergency department. Result her symptoms. Ms. Litz was felt to be stable for discharge. She was instructed to followup with her primary care physician in a week. Instructed to return to the emergency department if she develops worsening abdominal pain, fevers, chills, or inability to tolerate by mouth. Ms. Vicars was discharged in stable condition she was instructed to avoid alcohol the future. Labs reviewed by myself and considered and medical decision-making. Care was discussed with my attending Dr. Oletta Lamas.   1. Vomiting   2. Abdominal pain       Bethann Berkshire, MD 10/12/13 0028

## 2013-10-11 NOTE — ED Notes (Addendum)
Per pt sts she has been vomiting due to alcohol poisoning. sts she needs K in her IV. sts last drink was 1 am. sts vomiting blood.

## 2015-06-01 LAB — PROCEDURE REPORT - SCANNED: Pap: NEGATIVE

## 2015-11-11 ENCOUNTER — Emergency Department (HOSPITAL_COMMUNITY)
Admission: EM | Admit: 2015-11-11 | Discharge: 2015-11-12 | Disposition: A | Discharge status: Home / Self Care | Payer: Medicaid Other | Attending: Emergency Medicine | Admitting: Emergency Medicine

## 2015-11-11 ENCOUNTER — Encounter (HOSPITAL_COMMUNITY): Payer: Self-pay | Admitting: Vascular Surgery

## 2015-11-11 DIAGNOSIS — R109 Unspecified abdominal pain: Secondary | ICD-10-CM | POA: Insufficient documentation

## 2015-11-11 DIAGNOSIS — Z3202 Encounter for pregnancy test, result negative: Secondary | ICD-10-CM | POA: Insufficient documentation

## 2015-11-11 DIAGNOSIS — M791 Myalgia: Secondary | ICD-10-CM | POA: Insufficient documentation

## 2015-11-11 DIAGNOSIS — F17200 Nicotine dependence, unspecified, uncomplicated: Secondary | ICD-10-CM | POA: Insufficient documentation

## 2015-11-11 DIAGNOSIS — R112 Nausea with vomiting, unspecified: Secondary | ICD-10-CM | POA: Insufficient documentation

## 2015-11-11 LAB — COMPREHENSIVE METABOLIC PANEL
ALBUMIN: 4.4 g/dL (ref 3.5–5.0)
ALT: 25 U/L (ref 14–54)
AST: 47 U/L — AB (ref 15–41)
Alkaline Phosphatase: 54 U/L (ref 38–126)
Anion gap: 12 (ref 5–15)
BUN: 12 mg/dL (ref 6–20)
CHLORIDE: 106 mmol/L (ref 101–111)
CO2: 22 mmol/L (ref 22–32)
CREATININE: 0.88 mg/dL (ref 0.44–1.00)
Calcium: 9.5 mg/dL (ref 8.9–10.3)
GFR calc Af Amer: >60 mL/min (ref >60)
GFR calc non Af Amer: >60 mL/min (ref >60)
GLUCOSE: 148 mg/dL — AB (ref 65–99)
POTASSIUM: 3.4 mmol/L — AB (ref 3.5–5.1)
SODIUM: 140 mmol/L (ref 135–145)
Total Bilirubin: 0.8 mg/dL (ref 0.3–1.2)
Total Protein: 7.6 g/dL (ref 6.5–8.1)

## 2015-11-11 LAB — LIPASE, BLOOD: LIPASE: 22 U/L (ref 11–51)

## 2015-11-11 LAB — URINE MICROSCOPIC-ADD ON

## 2015-11-11 LAB — CBC
HEMATOCRIT: 38.4 % (ref 36.0–46.0)
Hemoglobin: 12.5 g/dL (ref 12.0–15.0)
MCH: 27.1 pg (ref 26.0–34.0)
MCHC: 32.6 g/dL (ref 30.0–36.0)
MCV: 83.3 fL (ref 78.0–100.0)
Platelets: 233 10*3/uL (ref 150–400)
RBC: 4.61 MIL/uL (ref 3.87–5.11)
RDW: 14.3 % (ref 11.5–15.5)
WBC: 8.3 10*3/uL (ref 4.0–10.5)

## 2015-11-11 LAB — URINALYSIS, ROUTINE W REFLEX MICROSCOPIC
BILIRUBIN URINE: NEGATIVE
GLUCOSE, UA: NEGATIVE mg/dL
LEUKOCYTES UA: NEGATIVE
Nitrite: NEGATIVE
PH: 6 (ref 5.0–8.0)
PROTEIN: 100 mg/dL — AB
Specific Gravity, Urine: 1.031 — ABNORMAL HIGH (ref 1.005–1.030)

## 2015-11-11 MED ORDER — ONDANSETRON 4 MG PO TBDP
ORAL_TABLET | ORAL | Status: AC
  Filled 2015-11-11: qty 1

## 2015-11-11 MED ORDER — ONDANSETRON 4 MG PO TBDP
4.0000 mg | ORAL_TABLET | Freq: Once | ORAL | Status: AC | PRN
Start: 2015-11-11 — End: 2015-11-11
  Administered 2015-11-11: 4 mg via ORAL

## 2015-11-11 MED ORDER — SODIUM CHLORIDE 0.9 % IV BOLUS (SEPSIS)
1000.0000 mL | Freq: Once | INTRAVENOUS | Status: AC
  Administered 2015-11-11: 1000 mL via INTRAVENOUS

## 2015-11-11 MED ORDER — ONDANSETRON HCL 4 MG/2ML IJ SOLN
4.0000 mg | Freq: Once | INTRAMUSCULAR | Status: AC
  Administered 2015-11-11: 4 mg via INTRAVENOUS
  Filled 2015-11-11: qty 2

## 2015-11-11 MED ORDER — KETOROLAC TROMETHAMINE 30 MG/ML IJ SOLN
30.0000 mg | Freq: Once | INTRAMUSCULAR | Status: AC
  Administered 2015-11-11: 30 mg via INTRAVENOUS
  Filled 2015-11-11: qty 1

## 2015-11-11 NOTE — ED Provider Notes (Signed)
CSN: [id number]     Arrival date & time 11/11/15  1933 History   First MD Initiated Contact with Patient 11/11/15 2208     Chief Complaint  Patient presents with  . Emesis     (Consider location/radiation/quality/duration/timing/severity/associated sxs/prior Treatment) HPI Comments: Presents with nausea and vomiting for the past one day, she reports greater than 20 episodes emesis. No fever or diarrhea. No known sick contacts or household members with similar symptoms. She denies bloody emesis with the exception of the last episode where it was blood tinged. She has generalized abdominal pain that is greater with active vomiting.   The history is provided by the patient. No language interpreter was used.    History reviewed. No pertinent past medical history. Past Surgical History  Procedure Laterality Date  . Ectopic pregnancy surgery    . Ectopic pregnancy surgery     No family history on file. Social History  Substance Use Topics  . Smoking status: Current Every Day Smoker  . Smokeless tobacco: None  . Alcohol Use: Yes   OB History    No data available     Review of Systems  Constitutional: Negative for fever and chills.  HENT: Negative.   Respiratory: Negative.  Negative for cough and shortness of breath.   Cardiovascular: Negative.   Gastrointestinal: Positive for nausea, vomiting and abdominal pain. Negative for diarrhea.  Genitourinary: Negative.   Musculoskeletal: Positive for myalgias.  Skin: Negative.  Negative for rash.  Neurological: Negative.  Negative for syncope.      Allergies  Erythromycin  Home Medications   Prior to Admission medications   Not on File   BP 164/82 mmHg  Pulse 63  Temp(Src) 98.1 F (36.7 C)  Resp 16  SpO2 100% Physical Exam  Constitutional: She is oriented to person, place, and time. She appears well-developed and well-nourished.  HENT:  Head: Normocephalic.  Neck: Normal range of motion. Neck supple.  Cardiovascular:  Normal rate and regular rhythm.   Pulmonary/Chest: Effort normal and breath sounds normal. She has no wheezes. She has no rales.  Abdominal: Soft. Bowel sounds are normal. There is tenderness. There is no rebound and no guarding.  Diffuse tenderness to soft abdomen. Actively vomiting.  Musculoskeletal: Normal range of motion.  Neurological: She is alert and oriented to person, place, and time.  Skin: Skin is warm and dry. No rash noted.  Psychiatric: She has a normal mood and affect.    ED Course  Procedures (including critical care time) Labs Review Labs Reviewed  COMPREHENSIVE METABOLIC PANEL - Abnormal; Notable for the following:    Potassium 3.4 (*)    Glucose, Bld 148 (*)    AST 47 (*)    All other components within normal limits  URINALYSIS, ROUTINE W REFLEX MICROSCOPIC (NOT AT [facility]) - Abnormal; Notable for the following:    APPearance CLOUDY (*)    Specific Gravity, Urine 1.031 (*)    Hgb urine dipstick MODERATE (*)    Ketones, ur >80 (*)    Protein, ur 100 (*)    All other components within normal limits  URINE MICROSCOPIC-ADD ON - Abnormal; Notable for the following:    Squamous Epithelial / LPF 6-30 (*)    Bacteria, UA FEW (*)    All other components within normal limits  LIPASE, BLOOD  CBC  PREGNANCY, URINE    Imaging Review No results found. I have personally reviewed and evaluated these images and lab results as part of my medical decision-making.  EKG Interpretation None      MDM   Final diagnoses:  None    1. Nausea and vomiting  Recheck: the patient has received IV fluids and is feeling a little better. Vomiting controlled. Labs pending for full evaluation. She is attempting PO fluids.   Toradol given for body aches. She is tolerating PO fluids and VS are stable. Likely viral etiology, with controlled symptoms in ED. She is felt stable for discharge.   Charlann Lange, PA-C 11/12/15 0010  Charlesetta Shanks, MD 11/14/15 [phone]

## 2015-11-11 NOTE — ED Notes (Signed)
Pt reports to the ED for eval of N/V x 24 hours. Pt reports she drank a lot of liquor the night before last and believes she may have ETOH poisoning. Pt denies any diarrhea. She also reports she cannot keep any water or food down. Pt actively dry heaving. Also reports abd pain. Pt A&Ox4, resp e/u, and skin warm and dry.

## 2015-11-12 LAB — PREGNANCY, URINE: PREG TEST UR: NEGATIVE

## 2015-11-12 MED ORDER — ONDANSETRON HCL 4 MG PO TABS: 4.0000 mg | ORAL_TABLET | Freq: Four times a day (QID) | ORAL | Status: DC

## 2015-11-12 NOTE — Discharge Instructions (Signed)

## 2016-01-04 ENCOUNTER — Emergency Department (INDEPENDENT_AMBULATORY_CARE_PROVIDER_SITE_OTHER)
Admission: EM | Admit: 2016-01-04 | Discharge: 2016-01-04 | Disposition: A | Discharge status: Home / Self Care | Payer: Self-pay | Source: Home / Self Care | Attending: Family Medicine | Admitting: Family Medicine

## 2016-01-04 ENCOUNTER — Emergency Department (INDEPENDENT_AMBULATORY_CARE_PROVIDER_SITE_OTHER): Payer: Self-pay

## 2016-01-04 ENCOUNTER — Encounter (HOSPITAL_COMMUNITY): Payer: Self-pay | Admitting: *Deleted

## 2016-01-04 DIAGNOSIS — J410 Simple chronic bronchitis: Secondary | ICD-10-CM

## 2016-01-04 DIAGNOSIS — J40 Bronchitis, not specified as acute or chronic: Secondary | ICD-10-CM

## 2016-01-04 DIAGNOSIS — Z720 Tobacco use: Secondary | ICD-10-CM

## 2016-01-04 MED ORDER — GUAIFENESIN-CODEINE 100-10 MG/5ML PO SYRP: 10.0000 mL | ORAL_SOLUTION | Freq: Four times a day (QID) | ORAL | Status: DC | PRN

## 2016-01-04 MED ORDER — IPRATROPIUM BROMIDE 0.06 % NA SOLN: 2.0000 | Freq: Four times a day (QID) | NASAL | Status: DC

## 2016-01-04 MED ORDER — DOXYCYCLINE HYCLATE 100 MG PO CAPS: 100.0000 mg | ORAL_CAPSULE | Freq: Two times a day (BID) | ORAL | Status: DC

## 2016-01-04 NOTE — Discharge Instructions (Signed)
Take all of medicine, drink lots of fluids, no more smoking, use mucinex and delsym for cough. see your doctor if further problems

## 2016-01-04 NOTE — ED Notes (Signed)
Pt reports       Symptoms   X  3  Days    With  Body  Aches    Chills   With  Cough   And  Congestion           Pt     Reports     Headache  And  Not  Sleeping  Well     Also    She  Is  Masked  And  Is  In a  Private room

## 2016-01-04 NOTE — ED Provider Notes (Addendum)
CSN: [id number]     Arrival date & time 01/04/16  1347 History   First MD Initiated Contact with Patient 01/04/16 1518     Chief Complaint  Patient presents with  . Cough   (Consider location/radiation/quality/duration/timing/severity/associated sxs/prior Treatment) Patient is a 35 y.o. female presenting with cough. The history is provided by the patient.  Cough Cough characteristics:  Productive Sputum characteristics:  Frothy and clear Severity:  Moderate Onset quality:  Gradual Duration:  4 days Progression:  Worsening Chronicity:  New Smoker: yes   Context: upper respiratory infection   Relieved by:  None tried Worsened by:  Nothing tried Ineffective treatments:  None tried Associated symptoms: fever, rhinorrhea and shortness of breath   Associated symptoms: no sore throat and no wheezing     History reviewed. No pertinent past medical history. Past Surgical History  Procedure Laterality Date  . Ectopic pregnancy surgery    . Ectopic pregnancy surgery     History reviewed. No pertinent family history. Social History  Substance Use Topics  . Smoking status: Former Research scientist (life sciences)  . Smokeless tobacco: None  . Alcohol Use: Yes   OB History    No data available     Review of Systems  Constitutional: Positive for fever, activity change and appetite change.  HENT: Positive for rhinorrhea. Negative for sore throat.   Respiratory: Positive for cough and shortness of breath. Negative for wheezing.   Cardiovascular: Negative.   Gastrointestinal: Negative.   Neurological: Positive for weakness and light-headedness.  All other systems reviewed and are negative.   Allergies  Erythromycin  Home Medications   Prior to Admission medications   Medication Sig Start Date End Date Taking? Authorizing Provider  doxycycline (VIBRAMYCIN) 100 MG capsule Take 1 capsule (100 mg total) by mouth 2 (two) times daily. 01/04/16   Billy Fischer, MD  guaiFENesin-codeine [facility]) 100-10  MG/5ML syrup Take 10 mLs by mouth 4 (four) times daily as needed for cough. 01/04/16   Billy Fischer, MD  ipratropium (ATROVENT) 0.06 % nasal spray Place 2 sprays into both nostrils 4 (four) times daily. 01/04/16   Billy Fischer, MD  ondansetron (ZOFRAN) 4 MG tablet Take 1 tablet (4 mg total) by mouth every 6 (six) hours. 11/12/15   Charlann Lange, PA-C   Meds Ordered and Administered this Visit  Medications - No data to display  BP 128/89 mmHg  Pulse 88  Temp(Src) 98 F (36.7 C) (Oral)  SpO2 100% No data found.   Physical Exam  Constitutional: She is oriented to person, place, and time. Vital signs are normal. She appears well-developed. She appears cachectic. She is cooperative. She has a sickly appearance. No distress.  In wheelchair.  HENT:  Right Ear: External ear normal.  Left Ear: External ear normal.  Mouth/Throat: Oropharynx is clear and moist.  Eyes: Pupils are equal, round, and reactive to light.  Neck: Normal range of motion. Neck supple.  Cardiovascular: Regular rhythm and normal heart sounds.   Pulmonary/Chest: She has no decreased breath sounds. She has no wheezes. She has rhonchi. She has no rales.  Lymphadenopathy:    She has no cervical adenopathy.  Neurological: She is alert and oriented to person, place, and time.  Skin: Skin is warm and dry.  Nursing note and vitals reviewed.   ED Course  Procedures (including critical care time)  Labs Review Labs Reviewed - No data to display  Imaging Review Dg Chest 2 View  01/04/2016  CLINICAL DATA:  Cough.  EXAM: CHEST  2 VIEW COMPARISON:  None. FINDINGS: The heart size and mediastinal contours are within normal limits. Both lungs are clear. No pneumothorax or pleural effusion is noted. The visualized skeletal structures are unremarkable. IMPRESSION: No active cardiopulmonary disease. Electronically Signed   By: Marijo Conception, M.D.   On: 01/04/2016 16:14   X-rays reviewed and report per radiologist.   Visual Acuity  Review  Right Eye Distance:   Left Eye Distance:   Bilateral Distance:    Right Eye Near:   Left Eye Near:    Bilateral Near:         MDM   1. Bronchitis due to tobacco use [facility])        Billy Fischer, MD 01/04/16 1627  Billy Fischer, MD 01/04/16 [phone]

## 2016-09-01 ENCOUNTER — Emergency Department (HOSPITAL_COMMUNITY)
Admission: EM | Admit: 2016-09-01 | Discharge: 2016-09-01 | Disposition: A | Discharge status: Home / Self Care | Payer: Self-pay | Attending: Emergency Medicine | Admitting: Emergency Medicine

## 2016-09-01 DIAGNOSIS — Z87891 Personal history of nicotine dependence: Secondary | ICD-10-CM | POA: Insufficient documentation

## 2016-09-01 DIAGNOSIS — Y999 Unspecified external cause status: Secondary | ICD-10-CM | POA: Insufficient documentation

## 2016-09-01 DIAGNOSIS — S61211A Laceration without foreign body of left index finger without damage to nail, initial encounter: Secondary | ICD-10-CM | POA: Insufficient documentation

## 2016-09-01 DIAGNOSIS — S61219A Laceration without foreign body of unspecified finger without damage to nail, initial encounter: Secondary | ICD-10-CM

## 2016-09-01 DIAGNOSIS — W260XXA Contact with knife, initial encounter: Secondary | ICD-10-CM | POA: Insufficient documentation

## 2016-09-01 DIAGNOSIS — Y939 Activity, unspecified: Secondary | ICD-10-CM | POA: Insufficient documentation

## 2016-09-01 DIAGNOSIS — Y929 Unspecified place or not applicable: Secondary | ICD-10-CM | POA: Insufficient documentation

## 2016-09-01 MED ORDER — LIDOCAINE HCL (PF) 1 % IJ SOLN
5.0000 mL | Freq: Once | INTRAMUSCULAR | Status: AC
  Administered 2016-09-01: 5 mL
  Filled 2016-09-01: qty 5

## 2016-09-01 MED ORDER — PENTAFLUOROPROP-TETRAFLUOROETH EX AERO
INHALATION_SPRAY | Freq: Once | CUTANEOUS | Status: DC
  Filled 2016-09-01: qty 103.5

## 2016-09-01 NOTE — Discharge Instructions (Signed)
Read the information below.  You may return to the Emergency Department at any time for worsening condition or any new symptoms that concern you.  If you develop redness, swelling, pus draining from the wound, or fevers greater than 100.4, return to the ER immediately for a recheck.   °

## 2016-09-01 NOTE — ED Triage Notes (Signed)
Patient here with small laceration to left hand index finger after cutting same this am with knife, minimal bleeding on arrival

## 2016-09-01 NOTE — ED Notes (Signed)
Soaking pt finger in NS per PA request

## 2016-09-01 NOTE — ED Provider Notes (Signed)
[facility] DEPT Provider Note   CSN: [id number] Arrival date & time: 09/01/16  1022 By signing my name below, I, Georgette Shell, attest that this documentation has been prepared under the direction and in the presence of Staci Carver, PA-C. Electronically Signed: Georgette Shell, ED Scribe. 09/01/16. 10:56 AM.  History   Chief Complaint Chief Complaint  Patient presents with  . Extremity Laceration   HPI Comments: Holly Hutchinson is a 35 y.o. female who presents to the Emergency Department complaining of a small laceration to left hand index finger s/p mechanical injury around 45 minutes ago. Pt rates her current pain a 8/10. Pt reports that she was slicing an orange and accidentally cut her finger with the knife. Pt states that the wound was bleeding excessively. Bleeding is controlled with a bandage. Pt applied alcohol to the area PTA. She is right-handed. Pt's Tdap is UTD. Pt denies fever.  The history is provided by the patient. No language interpreter was used.    No past medical history on file.  There are no active problems to display for this patient.   Past Surgical History:  Procedure Laterality Date  . ECTOPIC PREGNANCY SURGERY    . ECTOPIC PREGNANCY SURGERY      OB History    No data available       Home Medications    Prior to Admission medications   Medication Sig Start Date End Date Taking? Authorizing Provider  doxycycline (VIBRAMYCIN) 100 MG capsule Take 1 capsule (100 mg total) by mouth 2 (two) times daily. 01/04/16   Billy Fischer, MD  guaiFENesin-codeine [facility]) 100-10 MG/5ML syrup Take 10 mLs by mouth 4 (four) times daily as needed for cough. 01/04/16   Billy Fischer, MD  ipratropium (ATROVENT) 0.06 % nasal spray Place 2 sprays into both nostrils 4 (four) times daily. 01/04/16   Billy Fischer, MD  ondansetron (ZOFRAN) 4 MG tablet Take 1 tablet (4 mg total) by mouth every 6 (six) hours. 11/12/15   Charlann Lange, PA-C    Family History No family history  on file.  Social History Social History  Substance Use Topics  . Smoking status: Former Research scientist (life sciences)  . Smokeless tobacco: Not on file  . Alcohol use Yes     Allergies   Erythromycin   Review of Systems Review of Systems  Constitutional: Negative for chills, diaphoresis and fever.  Musculoskeletal: Negative for arthralgias and myalgias.  Skin: Positive for wound. Negative for color change.  Allergic/Immunologic: Negative for immunocompromised state.  Neurological: Negative for weakness and numbness.  Hematological: Does not bruise/bleed easily.  Psychiatric/Behavioral: Positive for self-injury (accidental ).     Physical Exam Updated Vital Signs BP 137/93 (BP Location: Left Arm)   Pulse 64   Temp 98.2 F (36.8 C) (Oral)   Resp 18   SpO2 100%   Physical Exam  Constitutional: She appears well-developed and well-nourished.  HENT:  Head: Normocephalic and atraumatic.  Neck: Neck supple.  Pulmonary/Chest: Effort normal.  Musculoskeletal:  Left index finger with small horizontal laceration lateral to the nail bed. No nail bed injury. Hemastatic. Full active range of motion of all digits, strength 5/5, sensation intact, capillary refill < 2 seconds.   Neurological: She is alert.  Nursing note and vitals reviewed.    ED Treatments / Results  DIAGNOSTIC STUDIES: Oxygen Saturation is 100% on RA, normal by my interpretation.    COORDINATION OF CARE: 10:54 AM Discussed treatment plan with pt at bedside which includes laceration  repair and pt agreed to plan.  Labs (all labs ordered are listed, but only abnormal results are displayed) Labs Reviewed - No data to display  EKG  EKG Interpretation None       Radiology No results found.  Procedures .Marland KitchenLaceration Repair Date/Time: 09/01/2016 10:56 AM Performed by: Clayton Bibles Authorized by: Clayton Bibles   Consent:    Consent obtained:  Verbal   Consent given by:  Patient Anesthesia (see MAR for exact dosages):     Anesthesia method:  None Laceration details:    Location:  Finger   Finger location:  L index finger Repair type:    Repair type:  Simple Pre-procedure details:    Preparation:  Patient was prepped and draped in usual sterile fashion Exploration:    Hemostasis achieved with:  Direct pressure   Wound exploration: wound explored through full range of motion     Contaminated: no   Treatment:    Area cleansed with:  Saline   Amount of cleaning:  Standard   Irrigation solution:  Sterile saline   Irrigation method:  Pressure wash   Visualized foreign bodies/material removed: no   Skin repair:    Repair method:  Steri-Strips   Number of Steri-Strips:  3 Approximation:    Approximation:  Close   Vermilion border: well-aligned   Post-procedure details:    Dressing:  Non-adherent dressing   Patient tolerance of procedure:  Tolerated well, no immediate complications     (including critical care time)  Medications Ordered in ED Medications  pentafluoroprop-tetrafluoroeth (GEBAUERS) aerosol (not administered)  lidocaine (PF) (XYLOCAINE) 1 % injection 5 mL (5 mLs Infiltration Given 09/01/16 1116)     Initial Impression / Assessment and Plan / ED Course  I have reviewed the triage vital signs and the nursing notes.  Pertinent labs & imaging results that were available during my care of the patient were reviewed by me and considered in my medical decision making (see chart for details).  Clinical Course    Afebrile, nontoxic patient with accidental laceration left index finger.  Neurovascularly intact.  Discussed wound repair methods with patient   Discussed the risks and benefits of different wound repair techniques with patient and advised sutures. Pt declined anything involving injection or needle and opted for steri-strips.  Tetanus UTD. D/C home with wound care instructions, return precautions.   Discussed result, findings, treatment, and follow up  with patient.  Pt given return  precautions.  Pt verbalizes understanding and agrees with plan.       Final Clinical Impressions(s) / ED Diagnoses   Final diagnoses:  Laceration of finger, initial encounter    New Prescriptions Discharge Medication List as of 09/01/2016 11:36 AM      I personally performed the services described in this documentation, which was scribed in my presence. The recorded information has been reviewed and is accurate.    Clayton Bibles, PA-C 09/01/16 [place], MD 09/03/16 [phone]

## 2016-09-11 ENCOUNTER — Encounter: Payer: Self-pay | Admitting: *Deleted

## 2016-11-18 ENCOUNTER — Emergency Department (HOSPITAL_COMMUNITY)
Admission: EM | Admit: 2016-11-18 | Discharge: 2016-11-18 | Disposition: A | Discharge status: Home / Self Care | Payer: Self-pay | Attending: Physician Assistant | Admitting: Physician Assistant

## 2016-11-18 ENCOUNTER — Encounter (HOSPITAL_COMMUNITY): Payer: Self-pay | Admitting: Emergency Medicine

## 2016-11-18 DIAGNOSIS — R112 Nausea with vomiting, unspecified: Secondary | ICD-10-CM | POA: Insufficient documentation

## 2016-11-18 DIAGNOSIS — Z87891 Personal history of nicotine dependence: Secondary | ICD-10-CM | POA: Insufficient documentation

## 2016-11-18 LAB — URINE MICROSCOPIC-ADD ON

## 2016-11-18 LAB — CBC
HCT: 38.8 % (ref 36.0–46.0)
HEMOGLOBIN: 12.9 g/dL (ref 12.0–15.0)
MCH: 26.9 pg (ref 26.0–34.0)
MCHC: 33.2 g/dL (ref 30.0–36.0)
MCV: 81 fL (ref 78.0–100.0)
Platelets: 271 10*3/uL (ref 150–400)
RBC: 4.79 MIL/uL (ref 3.87–5.11)
RDW: 14 % (ref 11.5–15.5)
WBC: 7.9 10*3/uL (ref 4.0–10.5)

## 2016-11-18 LAB — URINALYSIS, ROUTINE W REFLEX MICROSCOPIC
GLUCOSE, UA: NEGATIVE mg/dL
Ketones, ur: >80 mg/dL — AB
Nitrite: NEGATIVE
Protein, ur: 100 mg/dL — AB
SPECIFIC GRAVITY, URINE: 1.036 — AB (ref 1.005–1.030)
pH: 7 (ref 5.0–8.0)

## 2016-11-18 LAB — COMPREHENSIVE METABOLIC PANEL
ALBUMIN: 3.9 g/dL (ref 3.5–5.0)
ALT: 41 U/L (ref 14–54)
ANION GAP: 11 (ref 5–15)
AST: 48 U/L — ABNORMAL HIGH (ref 15–41)
Alkaline Phosphatase: 58 U/L (ref 38–126)
BUN: 12 mg/dL (ref 6–20)
CHLORIDE: 100 mmol/L — AB (ref 101–111)
CO2: 24 mmol/L (ref 22–32)
CREATININE: 0.66 mg/dL (ref 0.44–1.00)
Calcium: 9.6 mg/dL (ref 8.9–10.3)
GFR calc non Af Amer: >60 mL/min (ref >60)
GLUCOSE: 116 mg/dL — AB (ref 65–99)
Potassium: 3.1 mmol/L — ABNORMAL LOW (ref 3.5–5.1)
SODIUM: 135 mmol/L (ref 135–145)
Total Bilirubin: 0.7 mg/dL (ref 0.3–1.2)
Total Protein: 6.9 g/dL (ref 6.5–8.1)

## 2016-11-18 LAB — LIPASE, BLOOD: LIPASE: 21 U/L (ref 11–51)

## 2016-11-18 MED ORDER — GI COCKTAIL ~~LOC~~
30.0000 mL | Freq: Once | ORAL | Status: AC
  Administered 2016-11-18: 30 mL via ORAL
  Filled 2016-11-18: qty 30

## 2016-11-18 MED ORDER — ONDANSETRON HCL 4 MG PO TABS: 4.0000 mg | ORAL_TABLET | Freq: Three times a day (TID) | ORAL | 0 refills | Status: DC | PRN

## 2016-11-18 MED ORDER — RANITIDINE HCL 150 MG PO CAPS: 150.0000 mg | ORAL_CAPSULE | Freq: Every day | ORAL | 0 refills | Status: DC

## 2016-11-18 MED ORDER — CALCIUM CARBONATE ANTACID 600 MG PO CHEW: 600.0000 mg | CHEWABLE_TABLET | Freq: Four times a day (QID) | ORAL | 0 refills | Status: DC | PRN

## 2016-11-18 MED ORDER — ONDANSETRON HCL 4 MG/2ML IJ SOLN
4.0000 mg | Freq: Once | INTRAMUSCULAR | Status: AC
  Administered 2016-11-18: 4 mg via INTRAVENOUS
  Filled 2016-11-18: qty 2

## 2016-11-18 MED ORDER — SODIUM CHLORIDE 0.9 % IV BOLUS (SEPSIS)
1000.0000 mL | Freq: Once | INTRAVENOUS | Status: AC
  Administered 2016-11-18: 1000 mL via INTRAVENOUS

## 2016-11-18 NOTE — ED Provider Notes (Signed)
[facility] DEPT Provider Note   CSN: [id number] Arrival date & time: 11/18/16  1252  By signing my name below, I, Gwenlyn Fudge, attest that this documentation has been prepared under the direction and in the presence of Cherlynn Popiel Julio Alm, MD. Electronically Signed: Gwenlyn Fudge, ED Scribe. 11/18/16. 2:08 PM.   History   Chief Complaint Chief Complaint  Patient presents with  . Emesis  . Nausea  . Abdominal Pain    The history is provided by the patient. No language interpreter was used.   HPI Comments: Holly Hutchinson is a 35 y.o. female who presents to the Emergency Department complaining of gradual onset, episodic vomiting onset 2 days. Pt did not eat at all on 11/30 and had multiple shots that night. The following day she started to experience episodic vomiting, nausea, one episode of diarrhea, slight abdominal tenderness and sleep disturbance. Pt is requesting medication to help her sleep.  History reviewed. No pertinent past medical history.  There are no active problems to display for this patient.   Past Surgical History:  Procedure Laterality Date  . ECTOPIC PREGNANCY SURGERY    . ECTOPIC PREGNANCY SURGERY      OB History    No data available       Home Medications    Prior to Admission medications   Medication Sig Start Date End Date Taking? Authorizing Provider  Calcium Carbonate Antacid (MAALOX) 600 MG chewable tablet Chew 1 tablet (600 mg total) by mouth every 6 (six) hours as needed for heartburn. 11/18/16   Fynn Vanblarcom Lyn Micaiah Litle, MD  doxycycline (VIBRAMYCIN) 100 MG capsule Take 1 capsule (100 mg total) by mouth 2 (two) times daily. 01/04/16   Billy Fischer, MD  guaiFENesin-codeine [facility]) 100-10 MG/5ML syrup Take 10 mLs by mouth 4 (four) times daily as needed for cough. 01/04/16   Billy Fischer, MD  ipratropium (ATROVENT) 0.06 % nasal spray Place 2 sprays into both nostrils 4 (four) times daily. 01/04/16   Billy Fischer, MD  ondansetron  (ZOFRAN) 4 MG tablet Take 1 tablet (4 mg total) by mouth every 6 (six) hours. 11/12/15   Charlann Lange, PA-C  ondansetron (ZOFRAN) 4 MG tablet Take 1 tablet (4 mg total) by mouth every 8 (eight) hours as needed for nausea or vomiting. 11/18/16   Yehuda Printup Lyn Sian Rockers, MD  ranitidine (ZANTAC) 150 MG capsule Take 1 capsule (150 mg total) by mouth daily. 11/18/16   Beau Vanduzer Lyn Mallika Sanmiguel, MD    Family History No family history on file.  Social History Social History  Substance Use Topics  . Smoking status: Former Research scientist (life sciences)  . Smokeless tobacco: Former Systems developer  . Alcohol use Yes     Allergies   Erythromycin   Review of Systems Review of Systems  Gastrointestinal: Positive for abdominal pain, diarrhea, nausea and vomiting.  Psychiatric/Behavioral: Positive for sleep disturbance.  All other systems reviewed and are negative.  Physical Exam Updated Vital Signs BP 170/93 (BP Location: Right Arm)   Pulse (!) 57   Temp 99.1 F (37.3 C) (Oral)   Resp 18   Ht 5\' 3"  (1.6 m)   Wt 112 lb (50.8 kg)   LMP 11/04/2016   SpO2 100%   BMI 19.84 kg/m   Physical Exam  Constitutional: She is oriented to person, place, and time. She appears well-developed and well-nourished. She is active. No distress.  HENT:  Head: Normocephalic and atraumatic.  Eyes: Conjunctivae are normal.  Cardiovascular: Normal rate.   Pulmonary/Chest: Effort  normal. No respiratory distress.  Abdominal: There is tenderness (mild epigastric).  Musculoskeletal: Normal range of motion.  Neurological: She is alert and oriented to person, place, and time.  Skin: Skin is warm and dry.  Psychiatric: She has a normal mood and affect. Her behavior is normal.  Nursing note and vitals reviewed.   ED Treatments / Results  DIAGNOSTIC STUDIES: Oxygen Saturation is 98% on RA, normal by my interpretation.    COORDINATION OF CARE: 2:03 PM Discussed treatment plan with pt at bedside which includes GI Cocktail and pt agreed to  plan.  Labs (all labs ordered are listed, but only abnormal results are displayed) Labs Reviewed  COMPREHENSIVE METABOLIC PANEL - Abnormal; Notable for the following:       Result Value   Potassium 3.1 (*)    Chloride 100 (*)    Glucose, Bld 116 (*)    AST 48 (*)    All other components within normal limits  URINALYSIS, ROUTINE W REFLEX MICROSCOPIC (NOT AT [facility]) - Abnormal; Notable for the following:    Color, Urine AMBER (*)    APPearance CLOUDY (*)    Specific Gravity, Urine 1.036 (*)    Hgb urine dipstick SMALL (*)    Bilirubin Urine SMALL (*)    Ketones, ur >80 (*)    Protein, ur 100 (*)    Leukocytes, UA TRACE (*)    All other components within normal limits  URINE MICROSCOPIC-ADD ON - Abnormal; Notable for the following:    Squamous Epithelial / LPF 6-30 (*)    Bacteria, UA RARE (*)    All other components within normal limits  LIPASE, BLOOD  CBC    EKG  EKG Interpretation None       Radiology No results found.  Procedures Procedures (including critical care time)  Medications Ordered in ED Medications  sodium chloride 0.9 % bolus 1,000 mL (0 mLs Intravenous Stopped 11/18/16 1606)  ondansetron (ZOFRAN) injection 4 mg (4 mg Intravenous Given 11/18/16 1358)  gi cocktail (Maalox,Lidocaine,Donnatal) (30 mLs Oral Given 11/18/16 1445)     Initial Impression / Assessment and Plan / ED Course  I have reviewed the triage vital signs and the nursing notes.  Pertinent labs & imaging results that were available during my care of the patient were reviewed by me and considered in my medical decision making (see chart for details).  Clinical Course    I personally performed the services described in this documentation, which was scribed in my presence. The recorded information has been reviewed and is accurate.    Patient is a well-appearing 35 year old fetal presented with nausea and vomiting. She's had this that she took a couple of shots on Thursday  night. Since  then she's had nausea, trouble sleeping, epigastric pain. Patient has no tenderness on exam. I think this is likely alcoholic gastritis. We will treat with IV fluids, Zofran, GI cocktail. Plan to have patient discharged as soon as she is taking by mouth. Patient is very well-appearing on exam. Moist mucus membranes.Marland Kitchen  4:56 PM Patietn spat out gi cocktail. Was ablt to take ginger ale without issue. Will have her take maloox and zofran as needed.  Patient has normal vital signs at discharge.  Patient is comfortable, ambulatory, and taking PO at time of discharge.  Patient expressed understanding about return precautions.      Final Clinical Impressions(s) / ED Diagnoses   Final diagnoses:  Nausea and vomiting, intractability of vomiting not specified, unspecified vomiting type  New Prescriptions New Prescriptions   CALCIUM CARBONATE ANTACID (MAALOX) 600 MG CHEWABLE TABLET    Chew 1 tablet (600 mg total) by mouth every 6 (six) hours as needed for heartburn.   ONDANSETRON (ZOFRAN) 4 MG TABLET    Take 1 tablet (4 mg total) by mouth every 8 (eight) hours as needed for nausea or vomiting.   RANITIDINE (ZANTAC) 150 MG CAPSULE    Take 1 capsule (150 mg total) by mouth daily.     Fortune Brannigan Julio Alm, MD 11/18/16 [phone]

## 2016-11-18 NOTE — ED Notes (Signed)
Gave pt warm blanket. 

## 2016-11-18 NOTE — ED Notes (Signed)
Patient unable to give urine sample at with time.

## 2016-11-18 NOTE — Discharge Instructions (Signed)
You were seen with abdominal pain and nausea after drinking.  Please use Maloox at home to help with the epigastric discomfort.  You can use zofran for nausea.   Please return with any concerns.

## 2016-11-18 NOTE — ED Triage Notes (Signed)
Pt. Stated, I stated having N/V on Thursday and Im just very weak now. Im probably dehydrated.

## 2016-11-18 NOTE — ED Notes (Signed)
Gave pt water 

## 2017-02-25 ENCOUNTER — Ambulatory Visit: Payer: Self-pay | Admitting: Obstetrics and Gynecology

## 2017-09-10 ENCOUNTER — Ambulatory Visit (INDEPENDENT_AMBULATORY_CARE_PROVIDER_SITE_OTHER): Payer: Medicaid Other | Admitting: Obstetrics

## 2017-09-10 ENCOUNTER — Other Ambulatory Visit (HOSPITAL_COMMUNITY)
Admission: RE | Admit: 2017-09-10 | Discharge: 2017-09-10 | Disposition: A | Discharge status: Home / Self Care | Payer: Medicaid Other | Source: Ambulatory Visit | Attending: Obstetrics | Admitting: Obstetrics

## 2017-09-10 ENCOUNTER — Encounter: Payer: Self-pay | Admitting: Obstetrics

## 2017-09-10 VITALS — BP 124/86 | HR 90 | Wt 111.4 lb

## 2017-09-10 DIAGNOSIS — F17210 Nicotine dependence, cigarettes, uncomplicated: Secondary | ICD-10-CM

## 2017-09-10 DIAGNOSIS — Z202 Contact with and (suspected) exposure to infections with a predominantly sexual mode of transmission: Secondary | ICD-10-CM | POA: Insufficient documentation

## 2017-09-10 DIAGNOSIS — Z30013 Encounter for initial prescription of injectable contraceptive: Secondary | ICD-10-CM | POA: Insufficient documentation

## 2017-09-10 DIAGNOSIS — Z0000 Encounter for general adult medical examination without abnormal findings: Secondary | ICD-10-CM

## 2017-09-10 DIAGNOSIS — Z01419 Encounter for gynecological examination (general) (routine) without abnormal findings: Secondary | ICD-10-CM | POA: Diagnosis not present

## 2017-09-10 LAB — POCT URINE PREGNANCY: PREG TEST UR: NEGATIVE

## 2017-09-10 MED ORDER — MEDROXYPROGESTERONE ACETATE 150 MG/ML IM SUSP: 150.0000 mg | INTRAMUSCULAR | 0 refills | Status: DC

## 2017-09-10 MED ORDER — VARENICLINE TARTRATE 1 MG PO TABS: 1.0000 mg | ORAL_TABLET | Freq: Two times a day (BID) | ORAL | 1 refills | Status: DC

## 2017-09-10 MED ORDER — MEDROXYPROGESTERONE ACETATE 150 MG/ML IM SUSP
150.0000 mg | Freq: Once | INTRAMUSCULAR | Status: AC
  Administered 2017-09-10: 150 mg via INTRAMUSCULAR

## 2017-09-10 MED ORDER — PRENATE MINI 29-0.6-0.4-350 MG PO CAPS: 1.0000 | ORAL_CAPSULE | Freq: Every day | ORAL | 11 refills | Status: DC

## 2017-09-10 MED ORDER — VARENICLINE TARTRATE 0.5 MG X 11 & 1 MG X 42 PO MISC: ORAL | 0 refills | Status: DC

## 2017-09-10 NOTE — Progress Notes (Signed)
Patient is in the office today for annual exam- patient wants to continue her depo. Patient wants to discuss stopping smoking.

## 2017-09-10 NOTE — Addendum Note (Signed)
Addended by: Valli Glance F on: 09/10/2017 04:05 PM   Modules accepted: Orders

## 2017-09-10 NOTE — Progress Notes (Signed)
Subjective:        Holly Hutchinson is a 36 y.o. female here for a routine exam.  Current complaints: None.    Personal health questionnaire:  Is patient [name], have a family history of breast and/or ovarian cancer: no Is there a family history of uterine cancer diagnosed at age < 25, gastrointestinal cancer, urinary tract cancer, family member who is a Field seismologist syndrome-associated carrier: no Is the patient overweight and hypertensive, family history of diabetes, personal history of gestational diabetes, preeclampsia or PCOS: no Is patient over 64, have PCOS,  family history of premature CHD under age 33, diabetes, smoke, have hypertension or peripheral artery disease:  no At any time, has a partner hit, kicked or otherwise hurt or frightened you?: no Over the past 2 weeks, have you felt down, depressed or hopeless?: no Over the past 2 weeks, have you felt little interest or pleasure in doing things?:no   Gynecologic History No LMP recorded. Patient has had an injection. Contraception: Depo-Provera injections Last Pap: 2016. Results were: normal Last mammogram: n/a. Results were: normal  Obstetric History OB History  Gravida Para Term Preterm AB Living  6       2 4   SAB TAB Ectopic Multiple Live Births    1 1        # Outcome Date GA Lbr Len/2nd Weight Sex Delivery Anes PTL Lv  6 Gravida           5 Gravida           4 Gravida           3 Gravida           2 TAB           1 Ectopic               History reviewed. No pertinent past medical history.  Past Surgical History:  Procedure Laterality Date  . ECTOPIC PREGNANCY SURGERY       Current Outpatient Prescriptions:  .  medroxyPROGESTERone (DEPO-PROVERA) 150 MG/ML injection, Inject 1 mL (150 mg total) into the muscle every 3 (three) months., Disp: 1 mL, Rfl: 0 .  Prenat w/o A-FeCbn-Meth-FA-DHA (PRENATE MINI) 29-0.6-0.4-350 MG CAPS, Take 1 capsule by mouth daily before breakfast., Disp: 30 capsule, Rfl:  11 .  varenicline (CHANTIX CONTINUING MONTH PAK) 1 MG tablet, Take 1 tablet (1 mg total) by mouth 2 (two) times daily., Disp: 60 tablet, Rfl: 1 .  varenicline (CHANTIX STARTING MONTH PAK) 0.5 MG X 11 & 1 MG X 42 tablet, Take one 0.5 mg tablet by mouth once daily for 3 days, then increase to one 0.5 mg tablet twice daily for 4 days, then increase to one 1 mg tablet twice daily., Disp: 53 tablet, Rfl: 0 Allergies  Allergen Reactions  . Erythromycin Hives and Swelling    Social History  Substance Use Topics  . Smoking status: Heavy Tobacco Smoker    Packs/day: 0.50    Types: Cigarettes  . Smokeless tobacco: Former Systems developer  . Alcohol use Yes     Comment: social    History reviewed. No pertinent family history.    Review of Systems  Constitutional: negative for fatigue and weight loss Respiratory: negative for cough and wheezing Cardiovascular: negative for chest pain, fatigue and palpitations Gastrointestinal: negative for abdominal pain and change in bowel habits Musculoskeletal:negative for myalgias Neurological: negative for gait problems and tremors Behavioral/Psych: negative for abusive relationship, depression Endocrine: negative for temperature intolerance  Genitourinary:negative for abnormal menstrual periods, genital lesions, hot flashes, sexual problems and vaginal discharge Integument/breast: negative for breast lump, breast tenderness, nipple discharge and skin lesion(s)    Objective:       BP 124/86   Pulse 90   Wt 111 lb 6.4 oz (50.5 kg)   BMI 19.73 kg/m  General:   alert  Skin:   no rash or abnormalities  Lungs:   clear to auscultation bilaterally  Heart:   regular rate and rhythm, S1, S2 normal, no murmur, click, rub or gallop  Breasts:   normal without suspicious masses, skin or nipple changes or axillary nodes  Abdomen:  normal findings: no organomegaly, soft, non-tender and no hernia  Pelvis:  External genitalia: normal general appearance Urinary system:  urethral meatus normal and bladder without fullness, nontender Vaginal: normal without tenderness, induration or masses Cervix: normal appearance Adnexa: normal bimanual exam Uterus: anteverted and non-tender, normal size   Lab Review Urine pregnancy test Labs reviewed yes Radiologic studies reviewed no  50% of 20 min visit spent on counseling and coordination of care.    Assessment:     1. Encounter for routine gynecological examination with Papanicolaou smear of cervix  2. Exposure to STD Rx: - Cervicovaginal ancillary only  3. Encounter for initial prescription of injectable contraceptive Rx: - POCT urine pregnancy - medroxyPROGESTERone (DEPO-PROVERA) 150 MG/ML injection; Inject 1 mL (150 mg total) into the muscle every 3 (three) months.  Dispense: 1 mL; Refill: 0  4. Tobacco dependence due to cigarettes Rx: - varenicline (CHANTIX STARTING MONTH PAK) 0.5 MG X 11 & 1 MG X 42 tablet; Take one 0.5 mg tablet by mouth once daily for 3 days, then increase to one 0.5 mg tablet twice daily for 4 days, then increase to one 1 mg tablet twice daily.  Dispense: 53 tablet; Refill: 0 - varenicline (CHANTIX CONTINUING MONTH PAK) 1 MG tablet; Take 1 tablet (1 mg total) by mouth 2 (two) times daily.  Dispense: 60 tablet; Refill: 1  5. Routine adult health maintenance Rx: - Prenat w/o A-FeCbn-Meth-FA-DHA (PRENATE MINI) 29-0.6-0.4-350 MG CAPS; Take 1 capsule by mouth daily before breakfast.  Dispense: 30 capsule; Refill: 11   Plan:    Education reviewed: calcium supplements, depression evaluation, low fat, low cholesterol diet, safe sex/STD prevention, self breast exams, smoking cessation and weight bearing exercise. Contraception: Depo-Provera injections. Follow up in: 1 year.   Meds ordered this encounter  Medications  . medroxyPROGESTERone (DEPO-PROVERA) 150 MG/ML injection    Sig: Inject 1 mL (150 mg total) into the muscle every 3 (three) months.    Dispense:  1 mL    Refill:  0  .  varenicline (CHANTIX STARTING MONTH PAK) 0.5 MG X 11 & 1 MG X 42 tablet    Sig: Take one 0.5 mg tablet by mouth once daily for 3 days, then increase to one 0.5 mg tablet twice daily for 4 days, then increase to one 1 mg tablet twice daily.    Dispense:  53 tablet    Refill:  0  . varenicline (CHANTIX CONTINUING MONTH PAK) 1 MG tablet    Sig: Take 1 tablet (1 mg total) by mouth 2 (two) times daily.    Dispense:  60 tablet    Refill:  1  . Prenat w/o A-FeCbn-Meth-FA-DHA (PRENATE MINI) 29-0.6-0.4-350 MG CAPS    Sig: Take 1 capsule by mouth daily before breakfast.    Dispense:  30 capsule    Refill:  11  Orders Placed This Encounter  Procedures  . POCT urine pregnancy

## 2017-09-11 LAB — CERVICOVAGINAL ANCILLARY ONLY
BACTERIAL VAGINITIS: NEGATIVE
Candida vaginitis: NEGATIVE
Chlamydia: NEGATIVE
NEISSERIA GONORRHEA: NEGATIVE
Trichomonas: NEGATIVE

## 2017-10-14 ENCOUNTER — Telehealth: Payer: Self-pay | Admitting: Pediatrics

## 2017-10-14 DIAGNOSIS — F17210 Nicotine dependence, cigarettes, uncomplicated: Secondary | ICD-10-CM

## 2017-10-14 NOTE — Telephone Encounter (Signed)
With history of palpitations with Chantix, I don't feel comfortable prescribing Nicotine Patch. She should make appt. With PCP for this Rx.

## 2017-10-14 NOTE — Telephone Encounter (Signed)
LMTCB

## 2017-10-14 NOTE — Telephone Encounter (Signed)
Patient called into office today. She requested we discontinue Chantix.  She c/o heart palpitation when taking Chantix.  Pt requesting rx for Nicotine Patches be sent to pharmacy.

## 2017-10-15 NOTE — Telephone Encounter (Signed)
I spoke with patient and advised of recommendations, she voiced understanding and agreed with plan.

## 2017-10-15 NOTE — Telephone Encounter (Signed)
LMTCB

## 2017-12-02 ENCOUNTER — Ambulatory Visit: Payer: Medicaid Other

## 2018-01-07 ENCOUNTER — Encounter (HOSPITAL_COMMUNITY): Payer: Self-pay | Admitting: Emergency Medicine

## 2018-01-07 ENCOUNTER — Ambulatory Visit (INDEPENDENT_AMBULATORY_CARE_PROVIDER_SITE_OTHER): Payer: Medicaid Other

## 2018-01-07 ENCOUNTER — Other Ambulatory Visit: Payer: Self-pay

## 2018-01-07 ENCOUNTER — Ambulatory Visit (HOSPITAL_COMMUNITY)
Admission: EM | Admit: 2018-01-07 | Discharge: 2018-01-07 | Disposition: A | Discharge status: Home / Self Care | Payer: Medicaid Other | Attending: Family Medicine | Admitting: Family Medicine

## 2018-01-07 DIAGNOSIS — R112 Nausea with vomiting, unspecified: Secondary | ICD-10-CM

## 2018-01-07 MED ORDER — ONDANSETRON 4 MG PO TBDP
ORAL_TABLET | ORAL | Status: AC
  Filled 2018-01-07: qty 1

## 2018-01-07 MED ORDER — ONDANSETRON HCL 4 MG PO TABS: 4.0000 mg | ORAL_TABLET | Freq: Three times a day (TID) | ORAL | 0 refills | Status: DC | PRN

## 2018-01-07 MED ORDER — ONDANSETRON 4 MG PO TBDP
4.0000 mg | ORAL_TABLET | Freq: Once | ORAL | Status: AC
  Administered 2018-01-07: 4 mg via ORAL

## 2018-01-07 MED ORDER — GI COCKTAIL ~~LOC~~
30.0000 mL | Freq: Once | ORAL | Status: AC
  Administered 2018-01-07: 30 mL via ORAL

## 2018-01-07 MED ORDER — BACITRACIN ZINC 500 UNIT/GM EX OINT
TOPICAL_OINTMENT | CUTANEOUS | Status: AC
  Filled 2018-01-07: qty 0.9

## 2018-01-07 MED ORDER — GI COCKTAIL ~~LOC~~
ORAL | Status: AC
  Filled 2018-01-07: qty 30

## 2018-01-07 MED ORDER — OMEPRAZOLE 20 MG PO CPDR: 20.0000 mg | DELAYED_RELEASE_CAPSULE | Freq: Every day | ORAL | 0 refills | Status: DC

## 2018-01-07 NOTE — ED Provider Notes (Signed)
[facility]    CSN: [id number] Arrival date & time: 01/07/18  2694     History   Chief Complaint Chief Complaint  Patient presents with  . Emesis    HPI Holly Hutchinson is a 37 y.o. female.   Emmett presents with complaints of nausea and vomiting which started in the am of 1/20. She states she had drank wine, wine cooler and ate fish, which she feels attributed to the vomiting. States has a history of acid reflux which is worsened with vomiting. Very little intake due to vomiting. Denies abdominal pain. Urinating but decreased. Feels she has been constipated with small amount of stool out. Is passing gas. Normal urination. She gets depo, but has not been sexually active in >6 months. Refuses pregnancy test at this time. Fatigued. No known ill contacts.   ROS per HPI.       History reviewed. No pertinent past medical history.  There are no active problems to display for this patient.   Past Surgical History:  Procedure Laterality Date  . ECTOPIC PREGNANCY SURGERY      OB History    Gravida Para Term Preterm AB Living   6       2 4    SAB TAB Ectopic Multiple Live Births     1 1           Home Medications    Prior to Admission medications   Medication Sig Start Date End Date Taking? Authorizing Provider  medroxyPROGESTERone (DEPO-PROVERA) 150 MG/ML injection Inject 1 mL (150 mg total) into the muscle every 3 (three) months. 09/10/17   Shelly Bombard, MD  omeprazole (PRILOSEC) 20 MG capsule Take 1 capsule (20 mg total) by mouth daily. 01/07/18   Zigmund Gottron, NP  ondansetron (ZOFRAN) 4 MG tablet Take 1 tablet (4 mg total) by mouth every 8 (eight) hours as needed for nausea or vomiting. 01/07/18   Zigmund Gottron, NP  Prenat w/o A-FeCbn-Meth-FA-DHA (PRENATE MINI) 29-0.6-0.4-350 MG CAPS Take 1 capsule by mouth daily before breakfast. 09/10/17   Shelly Bombard, MD    Family History No family history on file.  Social History Social History     Tobacco Use  . Smoking status: Heavy Tobacco Smoker    Packs/day: 0.50    Types: Cigarettes  . Smokeless tobacco: Former Network engineer Use Topics  . Alcohol use: Yes    Comment: social  . Drug use: No     Allergies   Erythromycin   Review of Systems Review of Systems   Physical Exam Triage Vital Signs ED Triage Vitals [01/07/18 1922]  Enc Vitals Group     BP (!) 164/78     Pulse Rate 60     Resp 18     Temp 98 F (36.7 C)     Temp src      SpO2 100 %     Weight      Height      Head Circumference      Peak Flow      Pain Score      Pain Loc      Pain Edu?      Excl. in [place]?    No data found.  Updated Vital Signs BP (!) 164/78   Pulse 60   Temp 98 F (36.7 C)   Resp 18   SpO2 100%   Visual Acuity Right Eye Distance:   Left Eye Distance:   Bilateral Distance:  Right Eye Near:   Left Eye Near:    Bilateral Near:     Physical Exam  Constitutional: She is oriented to person, place, and time. She appears well-developed and well-nourished. No distress.  Cardiovascular: Normal rate, regular rhythm and normal heart sounds.  Pulmonary/Chest: Effort normal and breath sounds normal.  Abdominal: Soft. There is generalized tenderness.  Increased nausea and heaving with abdominal palpation; additional zofran and gi cocktail provided  Neurological: She is alert and oriented to person, place, and time.  Skin: Skin is warm and dry.     UC Treatments / Results  Labs (all labs ordered are listed, but only abnormal results are displayed) Labs Reviewed - No data to display  EKG  EKG Interpretation None       Radiology Dg Abd 2 Views  Result Date: 01/07/2018 CLINICAL DATA:  Vomiting and constipation. EXAM: ABDOMEN - 2 VIEW COMPARISON:  None. FINDINGS: Normal bowel gas pattern. Negative for free air. No large abdominal calcifications. Small pelvic calcifications are suggestive for phleboliths. Bone structures are unremarkable. No significant stool  burden in the abdomen or pelvis. IMPRESSION: Negative. Electronically Signed   By: Markus Daft M.D.   On: 01/07/2018 20:12    Procedures Procedures (including critical care time)  Medications Ordered in UC Medications  ondansetron (ZOFRAN-ODT) disintegrating tablet 4 mg (4 mg Oral Given 01/07/18 1928)  ondansetron (ZOFRAN-ODT) disintegrating tablet 4 mg (4 mg Oral Given 01/07/18 2010)  gi cocktail (Maalox,Lidocaine,Donnatal) (30 mLs Oral Given 01/07/18 2011)     Initial Impression / Assessment and Plan / UC Course  I have reviewed the triage vital signs and the nursing notes.  Pertinent labs & imaging results that were available during my care of the patient were reviewed by me and considered in my medical decision making (see chart for details).     Patient appears significantly improved after second zofran and gi cocktail. No further heaving or vomiting. Denies pain. Discussed return precautions. Negative xray for concern for obstruction. Liquid diet recommended. zofran prn. Return precautions provided. Patient verbalized understanding and agreeable to plan.  Ambulatory out of clinic without difficulty.    Final Clinical Impressions(s) / UC Diagnoses   Final diagnoses:  Non-intractable vomiting with nausea, unspecified vomiting type    ED Discharge Orders        Ordered    ondansetron (ZOFRAN) 4 MG tablet  Every 8 hours PRN     01/07/18 2035    omeprazole (PRILOSEC) 20 MG capsule  Daily     01/07/18 2035       Controlled Substance Prescriptions  Controlled Substance Registry consulted? Not Applicable   Zigmund Gottron, NP 01/07/18 2040

## 2018-01-07 NOTE — Discharge Instructions (Signed)
Liquid diet only until pain and vomiting have improved. Slowly advance to bland diet as tolerated.  Zofran as needed for nausea, may take another dose in 8 hours.  Daily omeprazole for reflux. If worsening of pain, fevers, nausea, vomiting or other worsening of symptoms please go to er.

## 2018-01-07 NOTE — ED Triage Notes (Signed)
Pt states she ate some fish and Saturday and shes been vomiting ever since. C/o hot and cold flashes. States "i've got acid reflux and I cant stop vomiting until they give me the medicine."

## 2018-01-30 ENCOUNTER — Ambulatory Visit (INDEPENDENT_AMBULATORY_CARE_PROVIDER_SITE_OTHER): Payer: Medicaid Other

## 2018-01-30 DIAGNOSIS — Z3202 Encounter for pregnancy test, result negative: Secondary | ICD-10-CM | POA: Diagnosis not present

## 2018-01-30 DIAGNOSIS — Z3042 Encounter for surveillance of injectable contraceptive: Secondary | ICD-10-CM

## 2018-01-30 LAB — POCT URINE PREGNANCY: PREG TEST UR: NEGATIVE

## 2018-01-30 NOTE — Progress Notes (Signed)
Ms. Blow presents today for UPT for Depo Restart. She has no unusual complaints. Pt had to leave after leaving urine sample. Intake was taken via phone.  LMP: 12/23/17    OBJECTIVE: Appears well, in no apparent distress.  OB History    Gravida Para Term Preterm AB Living   6       2 4    SAB TAB Ectopic Multiple Live Births     1 1         Home UPT Result: Negative  In-Office UPT result: Negative I have reviewed the patient's medical, obstetrical, social, and family histories, and medications.   ASSESSMENT: Negative pregnancy test  PLAN: Pt agrees to bring Depo NV and if UPT is neg, we will give inj.  Pt agrees no IC until after Depo has been administered IM.

## 2018-01-30 NOTE — Progress Notes (Signed)
I have reviewed this chart and agree with the RN/CMA assessment and management.    K. Meryl Khamiya Varin, M.D. Attending Obstetrician & Gynecologist, [facility], [facility]  

## 2018-02-12 ENCOUNTER — Other Ambulatory Visit: Payer: Self-pay | Admitting: Obstetrics

## 2018-02-12 ENCOUNTER — Other Ambulatory Visit: Payer: Self-pay

## 2018-02-12 DIAGNOSIS — Z30013 Encounter for initial prescription of injectable contraceptive: Secondary | ICD-10-CM

## 2018-02-13 ENCOUNTER — Ambulatory Visit (INDEPENDENT_AMBULATORY_CARE_PROVIDER_SITE_OTHER): Payer: Medicaid Other | Admitting: Obstetrics and Gynecology

## 2018-02-13 DIAGNOSIS — Z3202 Encounter for pregnancy test, result negative: Secondary | ICD-10-CM

## 2018-02-13 DIAGNOSIS — Z30013 Encounter for initial prescription of injectable contraceptive: Secondary | ICD-10-CM

## 2018-02-13 DIAGNOSIS — Z3042 Encounter for surveillance of injectable contraceptive: Secondary | ICD-10-CM

## 2018-02-13 LAB — POCT URINE PREGNANCY: PREG TEST UR: NEGATIVE

## 2018-02-13 MED ORDER — MEDROXYPROGESTERONE ACETATE 150 MG/ML IM SUSP
150.0000 mg | Freq: Once | INTRAMUSCULAR | Status: AC
  Administered 2018-02-13: 150 mg via INTRAMUSCULAR

## 2018-02-13 NOTE — Progress Notes (Signed)
Pt presents for 2nd UPT and Depo Injection.   Next Depo Due: May 16th -May 30th  Pt advised   Pt supplied.  Rt buttocks

## 2018-02-14 NOTE — Progress Notes (Addendum)
Nursing visit, patient not seen by this provider.  I have reviewed this chart and agree with the RN/CMA assessment and management.    Feliz Beam, M.D. Attending [place], [facility] for [employer], [place]

## 2018-04-22 ENCOUNTER — Encounter (HOSPITAL_COMMUNITY): Payer: Self-pay

## 2018-04-22 ENCOUNTER — Emergency Department (HOSPITAL_COMMUNITY)
Admission: EM | Admit: 2018-04-22 | Discharge: 2018-04-22 | Disposition: A | Discharge status: Home / Self Care | Payer: Medicaid Other | Attending: Emergency Medicine | Admitting: Emergency Medicine

## 2018-04-22 ENCOUNTER — Other Ambulatory Visit: Payer: Self-pay

## 2018-04-22 DIAGNOSIS — R109 Unspecified abdominal pain: Secondary | ICD-10-CM

## 2018-04-22 DIAGNOSIS — Z5321 Procedure and treatment not carried out due to patient leaving prior to being seen by health care provider: Secondary | ICD-10-CM | POA: Insufficient documentation

## 2018-04-22 DIAGNOSIS — R1110 Vomiting, unspecified: Secondary | ICD-10-CM | POA: Insufficient documentation

## 2018-04-22 LAB — I-STAT BETA HCG BLOOD, ED (MC, WL, AP ONLY)

## 2018-04-22 LAB — COMPREHENSIVE METABOLIC PANEL
ALT: 33 U/L (ref 14–54)
AST: 45 U/L — ABNORMAL HIGH (ref 15–41)
Albumin: 4.5 g/dL (ref 3.5–5.0)
Alkaline Phosphatase: 55 U/L (ref 38–126)
Anion gap: 13 (ref 5–15)
BUN: 10 mg/dL (ref 6–20)
CHLORIDE: 98 mmol/L — AB (ref 101–111)
CO2: 24 mmol/L (ref 22–32)
CREATININE: 0.66 mg/dL (ref 0.44–1.00)
Calcium: 9.6 mg/dL (ref 8.9–10.3)
Glucose, Bld: 124 mg/dL — ABNORMAL HIGH (ref 65–99)
Potassium: 3.1 mmol/L — ABNORMAL LOW (ref 3.5–5.1)
SODIUM: 135 mmol/L (ref 135–145)
TOTAL PROTEIN: 7.7 g/dL (ref 6.5–8.1)
Total Bilirubin: 1.2 mg/dL (ref 0.3–1.2)

## 2018-04-22 LAB — CBC
HCT: 41.8 % (ref 36.0–46.0)
Hemoglobin: 14.1 g/dL (ref 12.0–15.0)
MCH: 27.6 pg (ref 26.0–34.0)
MCHC: 33.7 g/dL (ref 30.0–36.0)
MCV: 82 fL (ref 78.0–100.0)
PLATELETS: 262 10*3/uL (ref 150–400)
RBC: 5.1 MIL/uL (ref 3.87–5.11)
RDW: 13.5 % (ref 11.5–15.5)
WBC: 10.4 10*3/uL (ref 4.0–10.5)

## 2018-04-22 LAB — LIPASE, BLOOD: LIPASE: 60 U/L — AB (ref 11–51)

## 2018-04-22 MED ORDER — ONDANSETRON 4 MG PO TBDP
4.0000 mg | ORAL_TABLET | Freq: Once | ORAL | Status: AC | PRN
  Administered 2018-04-22: 4 mg via ORAL
  Filled 2018-04-22: qty 1

## 2018-04-22 NOTE — ED Triage Notes (Signed)
Pt presents with emesis x 3 days.  Pt reports abdominal "soreness" , denies any diarrhea.

## 2018-04-22 NOTE — ED Notes (Signed)
No answer in waiting area for vitals

## 2018-04-22 NOTE — ED Notes (Signed)
Called for name several times with no answer

## 2018-04-26 NOTE — ED Provider Notes (Signed)
I did not see or evaluate the patient.  The patient left the emergency department prior to evaluation by a ER provider.   Jola Schmidt, MD 04/26/18 [phone]

## 2018-05-09 ENCOUNTER — Ambulatory Visit: Payer: Medicaid Other

## 2018-09-24 ENCOUNTER — Ambulatory Visit (HOSPITAL_COMMUNITY)
Admission: EM | Admit: 2018-09-24 | Discharge: 2018-09-24 | Disposition: A | Discharge status: Home / Self Care | Payer: Medicaid Other | Attending: Family Medicine | Admitting: Family Medicine

## 2018-09-24 ENCOUNTER — Telehealth (HOSPITAL_COMMUNITY): Payer: Self-pay | Admitting: Emergency Medicine

## 2018-09-24 ENCOUNTER — Encounter (HOSPITAL_COMMUNITY): Payer: Self-pay | Admitting: Emergency Medicine

## 2018-09-24 DIAGNOSIS — J209 Acute bronchitis, unspecified: Secondary | ICD-10-CM | POA: Diagnosis not present

## 2018-09-24 MED ORDER — DOXYCYCLINE HYCLATE 100 MG PO CAPS: 100.0000 mg | ORAL_CAPSULE | Freq: Two times a day (BID) | ORAL | 0 refills | Status: AC

## 2018-09-24 MED ORDER — ALBUTEROL SULFATE HFA 108 (90 BASE) MCG/ACT IN AERS: 1.0000 | INHALATION_SPRAY | Freq: Four times a day (QID) | RESPIRATORY_TRACT | 0 refills | Status: DC | PRN

## 2018-09-24 MED ORDER — BENZONATATE 200 MG PO CAPS: 200.0000 mg | ORAL_CAPSULE | Freq: Three times a day (TID) | ORAL | 0 refills | Status: DC | PRN

## 2018-09-24 MED ORDER — IPRATROPIUM-ALBUTEROL 0.5-2.5 (3) MG/3ML IN SOLN
3.0000 mL | Freq: Once | RESPIRATORY_TRACT | Status: AC
  Administered 2018-09-24: 3 mL via RESPIRATORY_TRACT

## 2018-09-24 MED ORDER — PREDNISONE 50 MG PO TABS: 50.0000 mg | ORAL_TABLET | Freq: Every day | ORAL | 0 refills | Status: AC

## 2018-09-24 MED ORDER — IPRATROPIUM-ALBUTEROL 0.5-2.5 (3) MG/3ML IN SOLN
RESPIRATORY_TRACT | Status: AC
  Filled 2018-09-24: qty 3

## 2018-09-24 MED ORDER — PROMETHAZINE-DM 6.25-15 MG/5ML PO SYRP: 5.0000 mL | ORAL_SOLUTION | Freq: Four times a day (QID) | ORAL | 0 refills | Status: DC | PRN

## 2018-09-24 NOTE — Telephone Encounter (Signed)
Tessalon not covered by insurance will try phenergan DM since patient has medicaid, OTC robitussin or delsym if not covered

## 2018-09-24 NOTE — Discharge Instructions (Signed)
We gave you breathing treatment today Please continue to use albuterol inhaler 1 to 2 puffs every 6 hours as needed for wheezing and shortness of breath Please begin prednisone daily for the next 5 days Doxycycline twice daily for the next week, this is an antibiotic Please use Tessalon every 8 hours as needed for cough May try mixing honey and hot tea to help with cough and throat discomfort  Please continue to monitor your symptoms and follow-up if symptoms worsening, developing fever, shortness of breath, chest pain, difficulty breathing, persistent symptoms

## 2018-09-24 NOTE — ED Triage Notes (Signed)
Pt c/o cough for several days, states "I think I have bronchitis im coughing up green stuff"

## 2018-09-25 NOTE — ED Provider Notes (Signed)
[place]    CSN: [id number] Arrival date & time: 09/24/18  1037     History   Chief Complaint Chief Complaint  Patient presents with  . Cough    HPI Holly Hutchinson is a 37 y.o. female no significant past medical history presenting today for evaluation of a cough.  Patient has concern for bronchitis based off her previous experience.  Patient notes that she is a cigarette user and this typically flares up when she is smoking.  She has tried to quit a few times.  Her main complaint is cough, cough is productive.  Feels wheezing in her chest.  Initially had congestion and sore throat, but the symptoms have improved and most of her symptoms are in her chest.  Denies fevers.  Has tried nebulizer at home as well as cough syrup without relief.  Denies nausea or vomiting.  Symptoms have been going on for 2 weeks.  HPI  History reviewed. No pertinent past medical history.  There are no active problems to display for this patient.   Past Surgical History:  Procedure Laterality Date  . ECTOPIC PREGNANCY SURGERY      OB History    Gravida  6   Para      Term      Preterm      AB  2   Living  4     SAB      TAB  1   Ectopic  1   Multiple      Live Births               Home Medications    Prior to Admission medications   Medication Sig Start Date End Date Taking? Authorizing Provider  albuterol (PROVENTIL HFA;VENTOLIN HFA) 108 (90 Base) MCG/ACT inhaler Inhale 1-2 puffs into the lungs every 6 (six) hours as needed for wheezing or shortness of breath. 09/24/18   Hasani Diemer C, PA-C  benzonatate (TESSALON) 200 MG capsule Take 1 capsule (200 mg total) by mouth 3 (three) times daily as needed for cough. 09/24/18   Urvi Imes C, PA-C  doxycycline (VIBRAMYCIN) 100 MG capsule Take 1 capsule (100 mg total) by mouth 2 (two) times daily for 7 days. 09/24/18 10/01/18  Shamela Haydon C, PA-C  medroxyPROGESTERone (DEPO-PROVERA) 150 MG/ML injection  ADMINISTER 1 ML(150 MG) IN THE MUSCLE EVERY 3 MONTHS 02/12/18   Shelly Bombard, MD  predniSONE (DELTASONE) 50 MG tablet Take 1 tablet (50 mg total) by mouth daily for 5 days. 09/24/18 09/29/18  Lataunya Ruud C, PA-C  promethazine-dextromethorphan (PROMETHAZINE-DM) 6.25-15 MG/5ML syrup Take 5 mLs by mouth 4 (four) times daily as needed for cough. 09/24/18   Carel Schnee, Elesa Hacker, PA-C    Family History No family history on file.  Social History Social History   Tobacco Use  . Smoking status: Heavy Tobacco Smoker    Packs/day: 0.50    Types: Cigarettes  . Smokeless tobacco: Former Network engineer Use Topics  . Alcohol use: Yes    Comment: social  . Drug use: No     Allergies   Erythromycin   Review of Systems Review of Systems  Constitutional: Negative for activity change, appetite change, chills, fatigue and fever.  HENT: Positive for congestion and sore throat. Negative for ear pain, rhinorrhea, sinus pressure and trouble swallowing.   Eyes: Negative for discharge and redness.  Respiratory: Positive for cough, shortness of breath and wheezing. Negative for chest tightness.   Cardiovascular: Negative for  chest pain.  Gastrointestinal: Negative for abdominal pain, diarrhea, nausea and vomiting.  Musculoskeletal: Negative for myalgias.  Skin: Negative for rash.  Neurological: Negative for dizziness, light-headedness and headaches.     Physical Exam Triage Vital Signs ED Triage Vitals [09/24/18 1201]  Enc Vitals Group     BP (!) 132/97     Pulse Rate 84     Resp 16     Temp 98.1 F (36.7 C)     Temp src      SpO2 98 %     Weight      Height      Head Circumference      Peak Flow      Pain Score 8     Pain Loc      Pain Edu?      Excl. in [place]?    No data found.  Updated Vital Signs BP (!) 132/97   Pulse 84   Temp 98.1 F (36.7 C)   Resp 16   LMP 09/24/2018   SpO2 98%   Visual Acuity Right Eye Distance:   Left Eye Distance:   Bilateral Distance:     Right Eye Near:   Left Eye Near:    Bilateral Near:     Physical Exam  Constitutional: She appears well-developed and well-nourished. No distress.  HENT:  Head: Normocephalic and atraumatic.  Bilateral ears without tenderness to palpation of external auricle, tragus and mastoid, EAC's without erythema or swelling, TM's with good bony landmarks and cone of light. Non erythematous.  Oral mucosa pink and moist, no tonsillar enlargement or exudate. Posterior pharynx patent and nonerythematous, no uvula deviation or swelling. Normal phonation.  Eyes: Conjunctivae are normal.  Neck: Neck supple.  Cardiovascular: Normal rate and regular rhythm.  No murmur heard. Pulmonary/Chest: Effort normal. No respiratory distress. She has wheezes.  Breathing comfortably at rest, frequent coughing in room especially with deep inspiration, significant inspiratory rhonchi and expiratory wheezing heard throughout bilateral lung fields upper and lower lung fields.  Abdominal: Soft. There is no tenderness.  Musculoskeletal: She exhibits no edema.  Neurological: She is alert.  Skin: Skin is warm and dry.  Psychiatric: She has a normal mood and affect.  Nursing note and vitals reviewed.    UC Treatments / Results  Labs (all labs ordered are listed, but only abnormal results are displayed) Labs Reviewed - No data to display  EKG None  Radiology No results found.  Procedures Procedures (including critical care time)  Medications Ordered in UC Medications  ipratropium-albuterol (DUONEB) 0.5-2.5 (3) MG/3ML nebulizer solution 3 mL (3 mLs Nebulization Given 09/24/18 1232)    Initial Impression / Assessment and Plan / UC Course  I have reviewed the triage vital signs and the nursing notes.  Pertinent labs & imaging results that were available during my care of the patient were reviewed by me and considered in my medical decision making (see chart for details).     Patient provided with DuoNeb in  clinic prior to discharge, minimal improvement of adventitious sounds on lung exam.  Will treat patient for bronchitis, provided albuterol, prednisone.  Given length of symptoms of 2 weeks will provide patient with doxycycline to cover atypical respiratory illness as well.  Initially prescribed Tessalon for cough, this was not covered by her medication, will try Phenergan DM.  Otherwise over-the-counter Robitussin or Delsym.Discussed strict return precautions. Patient verbalized understanding and is agreeable with plan.  Final Clinical Impressions(s) / UC Diagnoses   Final diagnoses:  Acute bronchitis, unspecified organism     Discharge Instructions     We gave you breathing treatment today Please continue to use albuterol inhaler 1 to 2 puffs every 6 hours as needed for wheezing and shortness of breath Please begin prednisone daily for the next 5 days Doxycycline twice daily for the next week, this is an antibiotic Please use Tessalon every 8 hours as needed for cough May try mixing honey and hot tea to help with cough and throat discomfort  Please continue to monitor your symptoms and follow-up if symptoms worsening, developing fever, shortness of breath, chest pain, difficulty breathing, persistent symptoms   ED Prescriptions    Medication Sig Dispense Auth. Provider   albuterol (PROVENTIL HFA;VENTOLIN HFA) 108 (90 Base) MCG/ACT inhaler Inhale 1-2 puffs into the lungs every 6 (six) hours as needed for wheezing or shortness of breath. 1 Inhaler Satya Bohall C, PA-C   predniSONE (DELTASONE) 50 MG tablet Take 1 tablet (50 mg total) by mouth daily for 5 days. 5 tablet Dalan Cowger C, PA-C   doxycycline (VIBRAMYCIN) 100 MG capsule Take 1 capsule (100 mg total) by mouth 2 (two) times daily for 7 days. 14 capsule Mariaceleste Herrera C, PA-C   benzonatate (TESSALON) 200 MG capsule Take 1 capsule (200 mg total) by mouth 3 (three) times daily as needed for cough. 20 capsule Wynette Jersey C,  PA-C     Controlled Substance Prescriptions [place] Controlled Substance Registry consulted? Not Applicable   Janith Lima, [place] 09/25/18 9675

## 2019-03-12 ENCOUNTER — Other Ambulatory Visit: Payer: Self-pay | Admitting: Obstetrics

## 2019-03-12 DIAGNOSIS — Z0000 Encounter for general adult medical examination without abnormal findings: Secondary | ICD-10-CM

## 2020-04-04 ENCOUNTER — Emergency Department (HOSPITAL_COMMUNITY)
Admission: EM | Admit: 2020-04-04 | Discharge: 2020-04-04 | Disposition: A | Discharge status: Home / Self Care | Payer: Medicaid Other | Attending: Emergency Medicine | Admitting: Emergency Medicine

## 2020-04-04 DIAGNOSIS — R6889 Other general symptoms and signs: Secondary | ICD-10-CM | POA: Insufficient documentation

## 2020-04-04 DIAGNOSIS — Z5321 Procedure and treatment not carried out due to patient leaving prior to being seen by health care provider: Secondary | ICD-10-CM | POA: Diagnosis not present

## 2020-04-04 NOTE — ED Triage Notes (Signed)
Pt. Refused to be triage wanted her daughter to come pick her up.

## 2021-06-05 ENCOUNTER — Emergency Department (HOSPITAL_COMMUNITY)
Admission: EM | Admit: 2021-06-05 | Discharge: 2021-06-05 | Disposition: A | Discharge status: Home / Self Care | Payer: Medicaid Other | Attending: Emergency Medicine | Admitting: Emergency Medicine

## 2021-06-05 DIAGNOSIS — K920 Hematemesis: Secondary | ICD-10-CM | POA: Diagnosis present

## 2021-06-05 DIAGNOSIS — Z5321 Procedure and treatment not carried out due to patient leaving prior to being seen by health care provider: Secondary | ICD-10-CM | POA: Insufficient documentation

## 2021-06-05 NOTE — ED Notes (Signed)
Pt states that she is leaving because she needs to go home and lay down. Pt refused to sign MSE

## 2021-08-01 ENCOUNTER — Ambulatory Visit (INDEPENDENT_AMBULATORY_CARE_PROVIDER_SITE_OTHER): Payer: Medicaid Other | Admitting: Obstetrics

## 2021-08-01 ENCOUNTER — Other Ambulatory Visit: Payer: Self-pay

## 2021-08-01 ENCOUNTER — Other Ambulatory Visit (HOSPITAL_COMMUNITY)
Admission: RE | Admit: 2021-08-01 | Discharge: 2021-08-01 | Disposition: A | Discharge status: Home / Self Care | Payer: Medicaid Other | Source: Ambulatory Visit | Attending: Obstetrics | Admitting: Obstetrics

## 2021-08-01 ENCOUNTER — Encounter: Payer: Self-pay | Admitting: Obstetrics

## 2021-08-01 VITALS — BP 118/79 | HR 79 | Ht 63.0 in | Wt 125.2 lb

## 2021-08-01 DIAGNOSIS — Z01419 Encounter for gynecological examination (general) (routine) without abnormal findings: Secondary | ICD-10-CM

## 2021-08-01 DIAGNOSIS — Z1239 Encounter for other screening for malignant neoplasm of breast: Secondary | ICD-10-CM | POA: Diagnosis not present

## 2021-08-01 DIAGNOSIS — R300 Dysuria: Secondary | ICD-10-CM

## 2021-08-01 DIAGNOSIS — Z30011 Encounter for initial prescription of contraceptive pills: Secondary | ICD-10-CM

## 2021-08-01 DIAGNOSIS — N898 Other specified noninflammatory disorders of vagina: Secondary | ICD-10-CM

## 2021-08-01 DIAGNOSIS — Z113 Encounter for screening for infections with a predominantly sexual mode of transmission: Secondary | ICD-10-CM | POA: Diagnosis not present

## 2021-08-01 DIAGNOSIS — Z0000 Encounter for general adult medical examination without abnormal findings: Secondary | ICD-10-CM | POA: Diagnosis not present

## 2021-08-01 DIAGNOSIS — Z3009 Encounter for other general counseling and advice on contraception: Secondary | ICD-10-CM

## 2021-08-01 DIAGNOSIS — M791 Myalgia, unspecified site: Secondary | ICD-10-CM

## 2021-08-01 LAB — POCT URINALYSIS DIPSTICK
Bilirubin, UA: NEGATIVE
Blood, UA: NEGATIVE
Glucose, UA: NEGATIVE
Ketones, UA: NEGATIVE
Leukocytes, UA: NEGATIVE
Nitrite, UA: NEGATIVE
Protein, UA: NEGATIVE
Spec Grav, UA: 1.02 (ref 1.010–1.025)
Urobilinogen, UA: 0.2 E.U./dL
pH, UA: 6.5 (ref 5.0–8.0)

## 2021-08-01 MED ORDER — LO LOESTRIN FE 1 MG-10 MCG / 10 MCG PO TABS: 1.0000 | ORAL_TABLET | Freq: Every day | ORAL | 4 refills | Status: DC

## 2021-08-01 MED ORDER — IBUPROFEN 800 MG PO TABS: 800.0000 mg | ORAL_TABLET | Freq: Three times a day (TID) | ORAL | 5 refills | Status: DC | PRN

## 2021-08-01 NOTE — Progress Notes (Signed)
Subjective:        Holly Hutchinson is a 40 y.o. female here for a routine exam.  Current complaints: Vaginal discharge.    Personal health questionnaire:  Is patient [name], have a family history of breast and/or ovarian cancer: no Is there a family history of uterine cancer diagnosed at age < 29, gastrointestinal cancer, urinary tract cancer, family member who is a Field seismologist syndrome-associated carrier: no Is the patient overweight and hypertensive, family history of diabetes, personal history of gestational diabetes, preeclampsia or PCOS: no Is patient over 90, have PCOS,  family history of premature CHD under age 75, diabetes, smoke, have hypertension or peripheral artery disease:  no At any time, has a partner hit, kicked or otherwise hurt or frightened you?: no Over the past 2 weeks, have you felt down, depressed or hopeless?: no Over the past 2 weeks, have you felt little interest or pleasure in doing things?:no   Gynecologic History No LMP recorded. Contraception: none Last Pap: ~ 3 years. Results were: normal Last mammogram: ~ 5 years. Results were: normal  Obstetric History OB History  Gravida Para Term Preterm AB Living  '6       2 4  '$ SAB IAB Ectopic Multiple Live Births    1 1        # Outcome Date GA Lbr Len/2nd Weight Sex Delivery Anes PTL Lv  6 Gravida           5 Gravida           4 Gravida           3 Gravida           2 IAB           1 Ectopic             History reviewed. No pertinent past medical history.  Past Surgical History:  Procedure Laterality Date   ECTOPIC PREGNANCY SURGERY       Current Outpatient Medications:    albuterol (PROVENTIL HFA;VENTOLIN HFA) 108 (90 Base) MCG/ACT inhaler, Inhale 1-2 puffs into the lungs every 6 (six) hours as needed for wheezing or shortness of breath., Disp: 1 Inhaler, Rfl: 0   ibuprofen (ADVIL) 800 MG tablet, Take 1 tablet (800 mg total) by mouth every 8 (eight) hours as needed., Disp: 30 tablet, Rfl:  5   Prenat-FeCbn-FeAsp-Meth-FA-DHA (PRENATE MINI) 18-0.6-0.4-350 MG CAPS, TAKE 1 CAPSULE BY MOUTH DAILY BEFORE BREAKFAST, Disp: 30 capsule, Rfl: 11   LO LOESTRIN FE 1 MG-10 MCG / 10 MCG tablet, Take 1 tablet by mouth daily. Start taking pills on the 1st day of period., Disp: 84 tablet, Rfl: 4 Allergies  Allergen Reactions   Erythromycin Hives and Swelling    Social History   Tobacco Use   Smoking status: Former    Packs/day: 0.50    Types: Cigarettes    Quit date: 06/2020    Years since quitting: 1.1   Smokeless tobacco: Former  Substance Use Topics   Alcohol use: Not Currently    Family History  Problem Relation Age of Onset   Breast cancer Maternal Grandmother       Review of Systems  Constitutional: negative for fatigue and weight loss Respiratory: negative for cough and wheezing Cardiovascular: negative for chest pain, fatigue and palpitations Gastrointestinal: negative for abdominal pain and change in bowel habits Musculoskeletal:negative for myalgias Neurological: negative for gait problems and tremors Behavioral/Psych: negative for abusive relationship, depression Endocrine: negative for temperature intolerance  Genitourinary:positive for vaginal discharge.  negative for abnormal menstrual periods, genital lesions, hot flashes, sexual problems  Integument/breast: negative for breast lump, breast tenderness, nipple discharge and skin lesion(s)    Objective:       BP 118/79   Pulse 79   Ht '5\' 3"'$  (1.6 m)   Wt 125 lb 3.2 oz (56.8 kg)   BMI 22.18 kg/m  General:   Alert and no distress  Skin:   no rash or abnormalities  Lungs:   clear to auscultation bilaterally  Heart:   regular rate and rhythm, S1, S2 normal, no murmur, click, rub or gallop  Breasts:   normal without suspicious masses, skin or nipple changes or axillary nodes  Abdomen:  normal findings: no organomegaly, soft, non-tender and no hernia  Pelvis:  External genitalia: normal general  appearance Urinary system: urethral meatus normal and bladder without fullness, nontender Vaginal: normal without tenderness, induration or masses Cervix: normal appearance Adnexa: normal bimanual exam Uterus: anteverted and non-tender, normal size   Lab Review Urine pregnancy test Labs reviewed yes Radiologic studies reviewed yes  I have spent a total of 20 minutes of face-to-face time, excluding clinical staff time, reviewing notes and preparing to see patient, ordering tests and/or medications, and counseling the patient.   Assessment:    1. Encounter for routine gynecological examination with Papanicolaou smear of cervix Rx: - Cytology - PAP( [place]) - TSH  2. Vaginal discharge Rx: - Cervicovaginal ancillary only( [place])  3. Screening for STD (sexually transmitted disease) Rx: - Hepatitis B surface antigen - Hepatitis C antibody - HIV Antibody (routine testing w rflx) - RPR  4. Screening breast examination Rx: - MM Digital Screening; Future  5. Dysuria Rx: - POCT Urinalysis Dipstick - Urine Culture  6. Myalgia Rx: - ibuprofen (ADVIL) 800 MG tablet; Take 1 tablet (800 mg total) by mouth every 8 (eight) hours as needed.  Dispense: 30 tablet; Refill: 5  7. Encounter for counseling regarding contraception - wants OCP's  46. Encounter for initial prescription of contraceptive pills Rx: - LO LOESTRIN FE 1 MG-10 MCG / 10 MCG tablet; Take 1 tablet by mouth daily. Start taking pills on the 1st day of period.  Dispense: 84 tablet; Refill: 4  9. Routine adult health maintenance Rx: - Ambulatory referral to Internal Medicine     Plan:    Education reviewed: calcium supplements, depression evaluation, low fat, low cholesterol diet, safe sex/STD prevention, self breast exams, and weight bearing exercise. Contraception: OCP (estrogen/progesterone). Follow up in: 1 year.   Meds ordered this encounter  Medications   DISCONTD: LO LOESTRIN FE 1 MG-10 MCG /  10 MCG tablet    Sig: Take 1 tablet by mouth daily.    Dispense:  84 tablet    Refill:  4    Submit other coverage code 3  BIN:  [id number]  PCN:  [id number]   GRP:  [id number]   ID:  [id number]   ibuprofen (ADVIL) 800 MG tablet    Sig: Take 1 tablet (800 mg total) by mouth every 8 (eight) hours as needed.    Dispense:  30 tablet    Refill:  5   LO LOESTRIN FE 1 MG-10 MCG / 10 MCG tablet    Sig: Take 1 tablet by mouth daily. Start taking pills on the 1st day of period.    Dispense:  84 tablet    Refill:  4    Submit other coverage code 3  BIN:  [id number]  PCN:  CN   GRP:  [id number]   ID:  [id number]   Orders Placed This Encounter  Procedures   Urine Culture    Dysuria   MM Digital Screening    Standing Status:   Future    Standing Expiration Date:   08/01/2022    Order Specific Question:   Reason for Exam (SYMPTOM  OR DIAGNOSIS REQUIRED)    Answer:   Screening    Order Specific Question:   Is the patient pregnant?    Answer:   No    Order Specific Question:   Preferred imaging location?    Answer:   [facility]   Hepatitis B surface antigen   Hepatitis C antibody   HIV Antibody (routine testing w rflx)   RPR   TSH   Ambulatory referral to Internal Medicine    Referral Priority:   Routine    Referral Type:   Consultation    Referral Reason:   Specialty Services Required    Requested Specialty:   Internal Medicine    Number of Visits Requested:   1   POCT Urinalysis Dipstick      Shelly Bombard, MD 08/02/2021 5:25 AM

## 2021-08-01 NOTE — Progress Notes (Signed)
Patient presents for AEX. Patient would like STD screening today. She also complains of having urinary urgency and discomfort.  Last pap: about 3 years ago at [facility]. Last MM: about 5 years

## 2021-08-02 LAB — CERVICOVAGINAL ANCILLARY ONLY
Bacterial Vaginitis (gardnerella): NEGATIVE
Candida Glabrata: NEGATIVE
Candida Vaginitis: NEGATIVE
Chlamydia: NEGATIVE
Comment: NEGATIVE
Comment: NEGATIVE
Comment: NEGATIVE
Comment: NEGATIVE
Comment: NEGATIVE
Comment: NORMAL
Neisseria Gonorrhea: NEGATIVE
Trichomonas: NEGATIVE

## 2021-08-03 LAB — URINE CULTURE

## 2021-08-03 LAB — HEPATITIS C ANTIBODY: Hep C Virus Ab: <0.1 s/co ratio (ref 0.0–0.9)

## 2021-08-03 LAB — HIV ANTIBODY (ROUTINE TESTING W REFLEX): HIV Screen 4th Generation wRfx: NONREACTIVE

## 2021-08-03 LAB — HEPATITIS B SURFACE ANTIGEN: Hepatitis B Surface Ag: NEGATIVE

## 2021-08-03 LAB — TSH: TSH: 0.242 u[IU]/mL — ABNORMAL LOW (ref 0.450–4.500)

## 2021-08-03 LAB — RPR: RPR Ser Ql: NONREACTIVE

## 2021-08-04 ENCOUNTER — Other Ambulatory Visit: Payer: Self-pay | Admitting: Obstetrics

## 2021-08-04 DIAGNOSIS — R7989 Other specified abnormal findings of blood chemistry: Secondary | ICD-10-CM

## 2021-08-04 LAB — CYTOLOGY - PAP
Comment: NEGATIVE
Comment: NEGATIVE
Diagnosis: UNDETERMINED — AB
HPV 16: NEGATIVE
HPV 18 / 45: NEGATIVE
High risk HPV: POSITIVE — AB

## 2021-08-07 ENCOUNTER — Other Ambulatory Visit: Payer: Self-pay

## 2021-08-07 DIAGNOSIS — Z0000 Encounter for general adult medical examination without abnormal findings: Secondary | ICD-10-CM

## 2021-08-07 MED ORDER — PRENATE MINI 18-0.6-0.4-350 MG PO CAPS: ORAL_CAPSULE | ORAL | 11 refills | Status: DC

## 2021-08-15 ENCOUNTER — Other Ambulatory Visit: Payer: Self-pay | Admitting: Obstetrics

## 2021-08-15 DIAGNOSIS — Z1239 Encounter for other screening for malignant neoplasm of breast: Secondary | ICD-10-CM

## 2021-08-18 ENCOUNTER — Encounter: Payer: Medicaid Other | Admitting: Obstetrics

## 2021-09-07 ENCOUNTER — Other Ambulatory Visit: Payer: Self-pay

## 2021-09-07 ENCOUNTER — Other Ambulatory Visit (HOSPITAL_COMMUNITY)
Admission: RE | Admit: 2021-09-07 | Discharge: 2021-09-07 | Disposition: A | Discharge status: Home / Self Care | Payer: Medicaid Other | Source: Ambulatory Visit | Attending: Obstetrics | Admitting: Obstetrics

## 2021-09-07 ENCOUNTER — Ambulatory Visit (INDEPENDENT_AMBULATORY_CARE_PROVIDER_SITE_OTHER): Payer: Medicaid Other | Admitting: Obstetrics

## 2021-09-07 ENCOUNTER — Encounter: Payer: Self-pay | Admitting: Obstetrics

## 2021-09-07 VITALS — BP 147/77 | HR 84 | Wt 123.0 lb

## 2021-09-07 DIAGNOSIS — R87811 Vaginal high risk human papillomavirus (HPV) DNA test positive: Secondary | ICD-10-CM | POA: Insufficient documentation

## 2021-09-07 DIAGNOSIS — Z01812 Encounter for preprocedural laboratory examination: Secondary | ICD-10-CM

## 2021-09-07 DIAGNOSIS — R87610 Atypical squamous cells of undetermined significance on cytologic smear of cervix (ASC-US): Secondary | ICD-10-CM | POA: Insufficient documentation

## 2021-09-07 DIAGNOSIS — R87620 Atypical squamous cells of undetermined significance on cytologic smear of vagina (ASC-US): Secondary | ICD-10-CM | POA: Diagnosis present

## 2021-09-07 LAB — POCT URINE PREGNANCY: Preg Test, Ur: NEGATIVE

## 2021-09-07 NOTE — Progress Notes (Signed)
Colposcopy Procedure Note  Indications: Pap smear 0 months ago showed: ASCUS with POSITIVE high risk HPV. The prior pap showed no abnormalities.  Prior cervical/vaginal disease: normal exam without visible pathology. Prior cervical treatment: no treatment.  Procedure Details  The risks and benefits of the procedure and Written informed consent obtained.  A time-out was performed confirming the patient, procedure and allergy status  Speculum placed in vagina and excellent visualization of cervix achieved, cervix swabbed x 3 with acetic acid solution.  Findings: Cervix: no visible lesions, no mosaicism, no punctation, and no abnormal vasculature; SCJ visualized 360 degrees without lesions, endocervical curettage performed, cervical biopsies taken at 6 and 12 o'clock, specimen labelled and sent to pathology, and hemostasis achieved with silver nitrate.   Vaginal inspection: normal without visible lesions. Vulvar colposcopy: vulvar colposcopy not performed.   Physical Exam   Specimens: ECC and cervical biopsies  Complications: none.  Plan: Specimens labelled and sent to Pathology. Will base further treatment on Pathology findings. Treatment options discussed with patient. Post biopsy instructions given to patient. Return to discuss Pathology results in 2 weeks  Shelly Bombard, MD 09/07/2021 4:50 PM .

## 2021-09-07 NOTE — Addendum Note (Signed)
Addended by: Penny Pia on: 09/07/2021 05:17 PM   Modules accepted: Orders

## 2021-09-11 LAB — SURGICAL PATHOLOGY

## 2021-09-11 NOTE — Progress Notes (Signed)
TC to patient to inform of results and need to schedule follow up PAP in one year. Patient verbalized understanding. Patient also requested new MyChart sign up link. Link sent via text per pt request.

## 2021-09-25 ENCOUNTER — Ambulatory Visit
Admission: RE | Admit: 2021-09-25 | Discharge: 2021-09-25 | Disposition: A | Discharge status: Home / Self Care | Payer: Medicaid Other | Source: Ambulatory Visit | Attending: Obstetrics | Admitting: Obstetrics

## 2021-09-25 ENCOUNTER — Other Ambulatory Visit: Payer: Self-pay

## 2021-09-25 DIAGNOSIS — Z1239 Encounter for other screening for malignant neoplasm of breast: Secondary | ICD-10-CM

## 2021-09-29 ENCOUNTER — Other Ambulatory Visit: Payer: Self-pay | Admitting: Obstetrics

## 2021-09-29 DIAGNOSIS — R928 Other abnormal and inconclusive findings on diagnostic imaging of breast: Secondary | ICD-10-CM

## 2021-10-18 ENCOUNTER — Ambulatory Visit
Admission: RE | Admit: 2021-10-18 | Discharge: 2021-10-18 | Disposition: A | Discharge status: Home / Self Care | Payer: Medicaid Other | Source: Ambulatory Visit | Attending: Obstetrics | Admitting: Obstetrics

## 2021-10-18 ENCOUNTER — Other Ambulatory Visit: Payer: Self-pay | Admitting: Obstetrics

## 2021-10-18 DIAGNOSIS — R928 Other abnormal and inconclusive findings on diagnostic imaging of breast: Secondary | ICD-10-CM

## 2022-01-30 ENCOUNTER — Ambulatory Visit (INDEPENDENT_AMBULATORY_CARE_PROVIDER_SITE_OTHER): Payer: Medicaid Other

## 2022-01-30 ENCOUNTER — Other Ambulatory Visit: Payer: Self-pay

## 2022-01-30 DIAGNOSIS — N926 Irregular menstruation, unspecified: Secondary | ICD-10-CM | POA: Diagnosis not present

## 2022-01-30 LAB — POCT URINE PREGNANCY: Preg Test, Ur: NEGATIVE

## 2022-01-30 NOTE — Progress Notes (Signed)
Holly Hutchinson presents today for UPT. She has no unusual complaints. Patient states that she has been spotting for the first few days of her cycle. Patient than states that her cycle came on fully. Patient does not desire pregnancy at this time. LMP: 01/26/22    OBJECTIVE: Appears well, in no apparent distress.  OB History     Gravida  6   Para      Term      Preterm      AB  2   Living  4      SAB      IAB  1   Ectopic  1   Multiple      Live Births             Home UPT Result: Negative In-Office UPT result:Negative I have reviewed the patient's medical, obstetrical, social, and family histories, and medications.   ASSESSMENT: Positive pregnancy test  PLAN Patient to follow up as needed.

## 2022-02-06 ENCOUNTER — Ambulatory Visit (INDEPENDENT_AMBULATORY_CARE_PROVIDER_SITE_OTHER): Payer: Medicaid Other

## 2022-02-06 ENCOUNTER — Ambulatory Visit (HOSPITAL_COMMUNITY)
Admission: EM | Admit: 2022-02-06 | Discharge: 2022-02-06 | Disposition: A | Discharge status: Home / Self Care | Payer: Medicaid Other | Attending: Nurse Practitioner | Admitting: Nurse Practitioner

## 2022-02-06 ENCOUNTER — Other Ambulatory Visit: Payer: Self-pay

## 2022-02-06 ENCOUNTER — Encounter (HOSPITAL_COMMUNITY): Payer: Self-pay

## 2022-02-06 DIAGNOSIS — J209 Acute bronchitis, unspecified: Secondary | ICD-10-CM | POA: Diagnosis not present

## 2022-02-06 DIAGNOSIS — J4521 Mild intermittent asthma with (acute) exacerbation: Secondary | ICD-10-CM | POA: Diagnosis not present

## 2022-02-06 DIAGNOSIS — R059 Cough, unspecified: Secondary | ICD-10-CM | POA: Diagnosis not present

## 2022-02-06 DIAGNOSIS — R062 Wheezing: Secondary | ICD-10-CM

## 2022-02-06 MED ORDER — PREDNISONE 20 MG PO TABS: 40.0000 mg | ORAL_TABLET | Freq: Every day | ORAL | 0 refills | Status: AC

## 2022-02-06 MED ORDER — ALBUTEROL SULFATE HFA 108 (90 BASE) MCG/ACT IN AERS: 1.0000 | INHALATION_SPRAY | Freq: Four times a day (QID) | RESPIRATORY_TRACT | 0 refills | Status: DC | PRN

## 2022-02-06 NOTE — ED Triage Notes (Signed)
Pt reports to the office today for cough and wheezing x 1 week. She is using her inhaler with no relief.

## 2022-02-06 NOTE — ED Provider Notes (Addendum)
[place]    CSN: [id number] Arrival date & time: 02/06/22  7824      History   Chief Complaint Chief Complaint  Patient presents with   Cough   Wheezing    HPI Holly Hutchinson is a 41 y.o. female.   LMP 01/21/2022 Patient presents with 1 week history of cough, congestion.  She reports initially, her symptoms were improving, however over the past couple days they have worsened.  She has been using her rescue inhaler without relief.  She reports chest tightness started yesterday as well as pain with inspiration.  She reports her cough is thick with productive mucus.  She endorses wheezing, chest tightness, chest and nasal congestion, sneezing, postnasal drip.  She denies fevers, body aches, chills, swollen glands, sinus pressure, ear pain, nausea vomiting or diarrhea.  She denies any change in her appetite.  She reports her energy levels are normal.  She has a history of asthma and is requesting a refill of her rescue inhaler.  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  She has not taken any over-the-counter medications for her symptoms.   History reviewed. No pertinent past medical history.  There are no problems to display for this patient.   Past Surgical History:  Procedure Laterality Date   ECTOPIC PREGNANCY SURGERY      OB History     Gravida  6   Para      Term      Preterm      AB  2   Living  4      SAB      IAB  1   Ectopic  1   Multiple      Live Births               Home Medications    Prior to Admission medications   Medication Sig Start Date End Date Taking? Authorizing Provider  albuterol (VENTOLIN HFA) 108 (90 Base) MCG/ACT inhaler Inhale 1-2 puffs into the lungs every 6 (six) hours as needed for wheezing or shortness of breath. 02/06/22  Yes Noemi Chapel A, NP  predniSONE (DELTASONE) 20 MG tablet Take 2 tablets (40 mg total) by mouth daily for 5 days. 02/06/22 02/11/22 Yes Eulogio Bear, NP   ibuprofen (ADVIL) 800 MG tablet Take 1 tablet (800 mg total) by mouth every 8 (eight) hours as needed. 08/01/21   Shelly Bombard, MD  LO LOESTRIN FE 1 MG-10 MCG / 10 MCG tablet Take 1 tablet by mouth daily. Start taking pills on the 1st day of period. 08/01/21   Shelly Bombard, MD  Prenat-FeCbn-FeAsp-Meth-FA-DHA (PRENATE MINI) 18-0.6-0.4-350 MG CAPS TAKE 1 CAPSULE BY MOUTH DAILY BEFORE BREAKFAST 08/07/21   Shelly Bombard, MD    Family History Family History  Problem Relation Age of Onset   Breast cancer Maternal Grandmother     Social History Social History   Tobacco Use   Smoking status: Former    Packs/day: 0.50    Types: Cigarettes    Quit date: 06/2020    Years since quitting: 1.6   Smokeless tobacco: Former  Scientific laboratory technician Use: Never used  Substance Use Topics   Alcohol use: Not Currently   Drug use: No     Allergies   Erythromycin   Review of Systems Review of Systems Per HPI  Physical Exam Triage Vital Signs ED Triage Vitals [02/06/22 0946]  Enc Vitals Group  BP (!) 145/99     Pulse Rate 74     Resp 18     Temp 98.5 F (36.9 C)     Temp Source Oral     SpO2 98 %     Weight      Height      Head Circumference      Peak Flow      Pain Score 3     Pain Loc      Pain Edu?      Excl. in [place]?    No data found.  Updated Vital Signs BP 111/70    Pulse 74    Temp 98.5 F (36.9 C) (Oral)    Resp 18    LMP 01/21/2022    SpO2 98%   Visual Acuity Right Eye Distance:   Left Eye Distance:   Bilateral Distance:    Right Eye Near:   Left Eye Near:    Bilateral Near:     Physical Exam Vitals and nursing note reviewed.  Constitutional:      General: She is not in acute distress.    Appearance: Normal appearance. She is not ill-appearing or toxic-appearing.  HENT:     Head: Normocephalic and atraumatic.     Right Ear: Tympanic membrane, ear canal and external ear normal.     Left Ear: Tympanic membrane, ear canal and external ear  normal.     Nose: Congestion and rhinorrhea present.     Right Sinus: No maxillary sinus tenderness or frontal sinus tenderness.     Left Sinus: No maxillary sinus tenderness or frontal sinus tenderness.     Mouth/Throat:     Mouth: Mucous membranes are moist.     Pharynx: Oropharynx is clear. No oropharyngeal exudate or posterior oropharyngeal erythema.  Eyes:     General: No scleral icterus.    Extraocular Movements: Extraocular movements intact.  Cardiovascular:     Rate and Rhythm: Normal rate and regular rhythm.     Heart sounds: Normal heart sounds. No murmur heard. Pulmonary:     Effort: Pulmonary effort is normal. No respiratory distress.     Breath sounds: Decreased breath sounds present. No wheezing, rhonchi or rales.  Musculoskeletal:     Cervical back: Normal range of motion and neck supple.  Lymphadenopathy:     Cervical: No cervical adenopathy.  Skin:    General: Skin is warm and dry.     Coloration: Skin is not jaundiced or pale.     Findings: No erythema or rash.  Neurological:     Mental Status: She is alert and oriented to person, place, and time.  Psychiatric:        Mood and Affect: Mood normal.        Behavior: Behavior normal.     UC Treatments / Results  Labs (all labs ordered are listed, but only abnormal results are displayed) Labs Reviewed - No data to display  EKG   Radiology DG Chest 2 View  Result Date: 02/06/2022 CLINICAL DATA:  Chest congestion with cough. Cough and wheeze. Asthma. EXAM: CHEST - 2 VIEW COMPARISON:  01/04/2016 FINDINGS: Normal heart size and mediastinal contours. Probable airway cuffing no acute infiltrate or edema. No effusion or pneumothorax. No acute osseous findings. IMPRESSION: Generalized airway thickening. Electronically Signed   By: Jorje Guild M.D.   On: 02/06/2022 10:19    Procedures Procedures (including critical care time)  Medications Ordered in UC Medications - No data to display  Initial Impression /  Assessment and Plan / UC Course  I have reviewed the triage vital signs and the nursing notes.  Pertinent labs & imaging results that were available during my care of the patient were reviewed by me and considered in my medical decision making (see chart for details).  Clinical Course as of 02/06/22 1109  Tue Feb 06, 2022  1032 DG Chest 2 View [JM]    Clinical Course User Index [JM] Noemi Chapel A, NP   Symptoms consistent with asthma exacerbation.  No wheezing on examination today; however breath sounds are decreased.  Chest x-ray today shows bronchial thickening, no evidence of pneumonia.  Treat patient with prednisone 40 mg daily for asthma exacerbation.  We will give refill of albuterol inhaler that she can use every 4-6 hours as needed for wheezing or chest tightness.  Encouraged pulmonary toilet with guaifenesin and fluids.  Encouraged her to establish care with primary care provider; referral placed.  Note given for work.   Final Clinical Impressions(s) / UC Diagnoses   Final diagnoses:  Mild intermittent asthma with exacerbation  Acute bronchitis, unspecified organism     Discharge Instructions      Your chest x-ray today did not show pneumonia.  Please start the prednisone as we discussed.  Please use the inhaler every 4-6 hours as needed for wheezing or shortness of breath.  Start Mucinex for the congestion and make sure you are drinking plenty of water.       ED Prescriptions     Medication Sig Dispense Auth. Provider   predniSONE (DELTASONE) 20 MG tablet Take 2 tablets (40 mg total) by mouth daily for 5 days. 10 tablet Noemi Chapel A, NP   albuterol (VENTOLIN HFA) 108 (90 Base) MCG/ACT inhaler Inhale 1-2 puffs into the lungs every 6 (six) hours as needed for wheezing or shortness of breath. 1 each Eulogio Bear, NP      PDMP not reviewed this encounter.   Eulogio Bear, NP 02/06/22 1107    Eulogio Bear, NP 02/06/22 1109

## 2022-02-06 NOTE — Discharge Instructions (Signed)
Your chest x-ray today did not show pneumonia.  Please start the prednisone as we discussed.  Please use the inhaler every 4-6 hours as needed for wheezing or shortness of breath.  Start Mucinex for the congestion and make sure you are drinking plenty of water.

## 2022-03-16 ENCOUNTER — Ambulatory Visit (INDEPENDENT_AMBULATORY_CARE_PROVIDER_SITE_OTHER): Payer: Medicaid Other | Admitting: Internal Medicine

## 2022-03-16 ENCOUNTER — Encounter: Payer: Self-pay | Admitting: Internal Medicine

## 2022-03-16 VITALS — BP 110/72 | HR 72 | Ht 63.0 in | Wt 118.6 lb

## 2022-03-16 DIAGNOSIS — E0590 Thyrotoxicosis, unspecified without thyrotoxic crisis or storm: Secondary | ICD-10-CM | POA: Diagnosis not present

## 2022-03-16 LAB — T4, FREE: Free T4: 0.7 ng/dL (ref 0.60–1.60)

## 2022-03-16 LAB — TSH: TSH: 0.23 u[IU]/mL — ABNORMAL LOW (ref 0.35–5.50)

## 2022-03-16 NOTE — Progress Notes (Signed)
? ? ?Name: Holly Hutchinson  ?MRN/ DOB: [id number], September 08, 1981    ?Age/ Sex: 41 y.o., female   ? ?PCP: Shelly Bombard, MD   ?Reason for Endocrinology Evaluation: Low TSH  ?   ?Date of Initial Endocrinology Evaluation: 03/16/2022   ? ? ?HPI: ?[name] is a 41 y.o. female with unremarkable past medical history. The patient presented for initial endocrinology clinic visit on 03/16/2022 for consultative assistance with her low TSH.  ? ?The patient has a history of low TSH dating back to 2010. ? ?She has hx of local neck swelling  ?She has noted weight loss , partly intentional  ? ?Denies loose stools unless taking an OTC supplements  ?Had palpitations ~ 2 yrs ago  ?No recent hand tremors  ?Has noted some increase in anxiety  ? ? ?Maternal and paternal aunts with thyroid disease  ? ? ?HISTORY:  ?Past Medical History: No past medical history on file. ?Past Surgical History:  ?Past Surgical History:  ?Procedure Laterality Date  ? ECTOPIC PREGNANCY SURGERY    ?  ?Social History:  reports that she quit smoking about 20 months ago. Her smoking use included cigarettes. She smoked an average of .5 packs per day. She has quit using smokeless tobacco. She reports that she does not currently use alcohol. She reports that she does not use drugs. ?Family History: family history includes Breast cancer in her maternal grandmother. ? ? ?HOME MEDICATIONS: ?Allergies as of 03/16/2022   ? ?   Reactions  ? Erythromycin Hives, Swelling  ? ?  ? ?  ?Medication List  ?  ? ?  ? Accurate as of March 16, 2022  5:52 PM. If you have any questions, ask your nurse or doctor.  ?  ?  ? ?  ? ?albuterol 108 (90 Base) MCG/ACT inhaler ?Commonly known as: VENTOLIN HFA ?Inhale 1-2 puffs into the lungs every 6 (six) hours as needed for wheezing or shortness of breath. ?  ?ibuprofen 800 MG tablet ?Commonly known as: ADVIL ?Take 1 tablet (800 mg total) by mouth every 8 (eight) hours as needed. ?  ?Lo Loestrin Fe 1 MG-10 MCG / 10 MCG tablet ?Generic  drug: Norethindrone-Ethinyl Estradiol-Fe Biphas ?Take 1 tablet by mouth daily. Start taking pills on the 1st day of period. ?  ?Prenate Mini 18-0.6-0.4-350 MG Caps ?TAKE 1 CAPSULE BY MOUTH DAILY BEFORE BREAKFAST ?  ? ?  ?  ? ? ?REVIEW OF SYSTEMS: ?A comprehensive ROS was conducted with the patient and is negative except as per HPI ? ? ? ?OBJECTIVE:  ?VS: BP 110/72 (BP Location: Right Arm, Patient Position: Sitting, Cuff Size: Normal)   Pulse 72   Ht '5\' 3"'$  (1.6 m)   Wt 118 lb 9.6 oz (53.8 kg)   SpO2 92%   BMI 21.01 kg/m?   ? ?Wt Readings from Last 3 Encounters:  ?03/16/22 118 lb 9.6 oz (53.8 kg)  ?09/07/21 123 lb (55.8 kg)  ?08/01/21 125 lb 3.2 oz (56.8 kg)  ? ? ? ?EXAM: ?General: Pt appears well and is in NAD  ?Eyes: External eye exam normal without stare, lid lag or exophthalmos.  EOM intact.  PERRL.  ?Neck: General: Supple without adenopathy. ?Thyroid: Thyroid size normal.  No goiter or nodules appreciated. No thyroid bruit.  ?Lungs: Clear with good BS bilat with no rales, rhonchi, or wheezes  ?Heart: Auscultation: RRR.  ?Abdomen: Normoactive bowel sounds, soft, nontender, without masses or organomegaly palpable  ?Extremities:  ?BL LE: No  pretibial edema normal ROM and strength.  ?Mental Status: Judgment, insight: Intact ?Orientation: Oriented to time, place, and person ?Mood and affect: No depression, anxiety, or agitation  ? ? ? ?DATA REVIEWED: ? ?  ? Latest Reference Range & Units 03/16/22 10:27  ?TSH 0.35 - 5.50 uIU/mL 0.23 (L)  ?T4,Free(Direct) 0.60 - 1.60 ng/dL 0.70  ?(L): Data is abnormally low ? ?ASSESSMENT/PLAN/RECOMMENDATIONS:  ? ?Subclinical Hyperthyroidism ? ?-Patient is clinically euthyroid ?-No local neck symptoms ?-We discussed differential diagnosis includes Graves' disease versus autonomous thyroid nodule versus subacute thyroiditis ?-Her levels could also be indicative of TSH variant , especially with a free T4 at the low end of normal, approximately 3% of healthy african americans have a  serum TSH of < 0.4 uIU/mL due to a shift in the distribution of TSH values in normal people of african american descent.  ?- Will proceed thyroid ultrasound ?-No indications to treat at this time ? ?Follow-up 4 months ? ? ?Signed electronically by: ?Abby Nena Jordan, MD ? ?[facility]  ?[facility] ?[address] ?[place], [place] [address] ?Phone: [phone] ?FAX: [phone] ? ? ?CC: ?Shelly Bombard, MD ?[address] ?[place] [address] ?Phone: [phone] ?Fax: [phone] ? ? ?Return to Endocrinology clinic as below: ?Future Appointments  ?Date Time Provider [place] [address] [place]  ?04/18/2022  9:30 AM [facility] DIAG [place] 2 [facility]  ?04/18/2022  9:40 AM [facility] [place] 2 [facility]  ?08/01/2022  9:10 AM Paxtyn Boyar, Melanie Crazier, MD [facility] None  ?  ? ? ? ? ? ?

## 2022-03-20 LAB — T3: T3, Total: 113 ng/dL (ref 76–181)

## 2022-03-20 LAB — TRAB (TSH RECEPTOR BINDING ANTIBODY): TRAB: <1 IU/L (ref <=2.00)

## 2022-03-21 ENCOUNTER — Ambulatory Visit
Admission: RE | Admit: 2022-03-21 | Discharge: 2022-03-21 | Disposition: A | Discharge status: Home / Self Care | Payer: Medicaid Other | Source: Ambulatory Visit | Attending: Internal Medicine | Admitting: Internal Medicine

## 2022-03-21 DIAGNOSIS — E0590 Thyrotoxicosis, unspecified without thyrotoxic crisis or storm: Secondary | ICD-10-CM

## 2022-04-16 HISTORY — PX: BREAST BIOPSY: SHX20

## 2022-04-18 ENCOUNTER — Other Ambulatory Visit: Payer: Medicaid Other

## 2022-04-26 ENCOUNTER — Ambulatory Visit (HOSPITAL_COMMUNITY)
Admission: EM | Admit: 2022-04-26 | Discharge: 2022-04-26 | Disposition: A | Discharge status: Home / Self Care | Payer: Medicaid Other | Attending: Internal Medicine | Admitting: Internal Medicine

## 2022-04-26 ENCOUNTER — Encounter (HOSPITAL_COMMUNITY): Payer: Self-pay | Admitting: Emergency Medicine

## 2022-04-26 DIAGNOSIS — D1722 Benign lipomatous neoplasm of skin and subcutaneous tissue of left arm: Secondary | ICD-10-CM | POA: Diagnosis not present

## 2022-04-26 MED ORDER — ACETAMINOPHEN 325 MG PO TABS
650.0000 mg | ORAL_TABLET | Freq: Once | ORAL | Status: AC
  Administered 2022-04-26: 650 mg via ORAL

## 2022-04-26 MED ORDER — ACETAMINOPHEN 325 MG PO TABS
ORAL_TABLET | ORAL | Status: AC
  Filled 2022-04-26: qty 2

## 2022-04-26 NOTE — Discharge Instructions (Signed)
It appears that have a lipoma on your arm.  An ultrasound can be completed to help confirm this.  You may follow-up with orthopedist or primary care for further evaluation and management of this. ?

## 2022-04-26 NOTE — ED Provider Notes (Signed)
?[place] ? ? ? ?CSN: [id number] ?Arrival date & time: 04/26/22  1801 ? ? ?  ? ?History   ?Chief Complaint ?Chief Complaint  ?Patient presents with  ? Insect Bite  ? ? ?HPI ?PEARLENA OW is a 41 y.o. female.  ? ?Patient presents with area of swelling to left outer arm that she noticed yesterday.  Denies any apparent injury to the area.  Denies any obvious insect or spider bite.  Denies purulent drainage from the area.  Patient has full range of motion of arm.  Denies any numbness or tingling. ? ? ? ?History reviewed. No pertinent past medical history. ? ?There are no problems to display for this patient. ? ? ?Past Surgical History:  ?Procedure Laterality Date  ? ECTOPIC PREGNANCY SURGERY    ? ? ?OB History   ? ? Gravida  ?6  ? Para  ?   ? Term  ?   ? Preterm  ?   ? AB  ?2  ? Living  ?4  ?  ? ? SAB  ?   ? IAB  ?1  ? Ectopic  ?1  ? Multiple  ?   ? Live Births  ?   ?   ?  ?  ? ? ? ?Home Medications   ? ?Prior to Admission medications   ?Medication Sig Start Date End Date Taking? Authorizing Provider  ?albuterol (VENTOLIN HFA) 108 (90 Base) MCG/ACT inhaler Inhale 1-2 puffs into the lungs every 6 (six) hours as needed for wheezing or shortness of breath. 02/06/22   Eulogio Bear, NP  ?ibuprofen (ADVIL) 800 MG tablet Take 1 tablet (800 mg total) by mouth every 8 (eight) hours as needed. 08/01/21   Shelly Bombard, MD  ?LO LOESTRIN FE 1 MG-10 MCG / 10 MCG tablet Take 1 tablet by mouth daily. Start taking pills on the 1st day of period. 08/01/21   Shelly Bombard, MD  ?Prenat-FeCbn-FeAsp-Meth-FA-DHA [facility] MINI) 18-0.6-0.4-350 MG CAPS TAKE 1 CAPSULE BY MOUTH DAILY BEFORE BREAKFAST 08/07/21   Shelly Bombard, MD  ? ? ?Family History ?Family History  ?Problem Relation Age of Onset  ? Breast cancer Maternal Grandmother   ? ? ?Social History ?Social History  ? ?Tobacco Use  ? Smoking status: Former  ?  Packs/day: 0.50  ?  Types: Cigarettes  ?  Quit date: 06/2020  ?  Years since quitting: 1.8  ?  Smokeless tobacco: Former  ?Vaping Use  ? Vaping Use: Never used  ?Substance Use Topics  ? Alcohol use: Not Currently  ? Drug use: No  ? ? ? ?Allergies   ?Erythromycin ? ? ?Review of Systems ?Review of Systems ?Per HPI ? ?Physical Exam ?Triage Vital Signs ?ED Triage Vitals [04/26/22 1836]  ?Enc Vitals Group  ?   BP 129/84  ?   Pulse Rate 69  ?   Resp 17  ?   Temp 98 ?F (36.7 ?C)  ?   Temp src   ?   SpO2 98 %  ?   Weight   ?   Height   ?   Head Circumference   ?   Peak Flow   ?   Pain Score 0  ?   Pain Loc   ?   Pain Edu?   ?   Excl. in [place]?   ? ?No data found. ? ?Updated Vital Signs ?BP 129/84   Pulse 69   Temp 98 ?F (36.7 ?C)   Resp  17   SpO2 98%  ? ?Visual Acuity ?Right Eye Distance:   ?Left Eye Distance:   ?Bilateral Distance:   ? ?Right Eye Near:   ?Left Eye Near:    ?Bilateral Near:    ? ?Physical Exam ?Constitutional:   ?   General: She is not in acute distress. ?   Appearance: Normal appearance. She is not toxic-appearing or diaphoretic.  ?HENT:  ?   Head: Normocephalic and atraumatic.  ?Eyes:  ?   Extraocular Movements: Extraocular movements intact.  ?   Conjunctiva/sclera: Conjunctivae normal.  ?Pulmonary:  ?   Effort: Pulmonary effort is normal.  ?Skin: ?   Comments: Approximately 2 to 3 inch in diameter spongy area of swelling located to left outer arm.  No obvious purulent drainage, redness, area concerning for bite.  Area is mobile.  Tender to palpation.  ?Neurological:  ?   General: No focal deficit present.  ?   Mental Status: She is alert and oriented to person, place, and time. Mental status is at baseline.  ?Psychiatric:     ?   Mood and Affect: Mood normal.     ?   Behavior: Behavior normal.     ?   Thought Content: Thought content normal.     ?   Judgment: Judgment normal.  ? ? ? ?UC Treatments / Results  ?Labs ?(all labs ordered are listed, but only abnormal results are displayed) ?Labs Reviewed - No data to display ? ?EKG ? ? ?Radiology ?No results found. ? ?Procedures ?Procedures (including  critical care time) ? ?Medications Ordered in UC ?Medications  ?acetaminophen (TYLENOL) tablet 650 mg (has no administration in time range)  ? ? ?Initial Impression / Assessment and Plan / UC Course  ?I have reviewed the triage vital signs and the nursing notes. ? ?Pertinent labs & imaging results that were available during my care of the patient were reviewed by me and considered in my medical decision making (see chart for details). ? ?  ? ?Lesion to arm appears to be a lipoma.  No concern for abscess, insect bite, spider bite, muscular strain/injury.  I do think the patient needs ultrasound to confirm given that it is causing her some discomfort.  Patient advised that she may follow-up with PCP for this and she was also provided with contact information for other resources to have this completed.  Do not have ultrasound in urgent care.  Discussed supportive care with patient.  Discussed return precautions.  Patient verbalized understanding and was agreeable with plan. ?Final Clinical Impressions(s) / UC Diagnoses  ? ?Final diagnoses:  ?Lipoma of left upper extremity  ? ? ? ?Discharge Instructions   ? ?  ?It appears that have a lipoma on your arm.  An ultrasound can be completed to help confirm this.  You may follow-up with orthopedist or primary care for further evaluation and management of this. ? ? ? ?ED Prescriptions   ?None ?  ? ?PDMP not reviewed this encounter. ?  ?Teodora Medici, [place] ?04/26/22 1900 ? ?

## 2022-04-26 NOTE — ED Triage Notes (Signed)
Pt is present today with a possible insect bite on her left arm. Pt states hat she cant recall when she was bitten.  ?

## 2022-05-02 ENCOUNTER — Ambulatory Visit
Admission: RE | Admit: 2022-05-02 | Discharge: 2022-05-02 | Disposition: A | Discharge status: Home / Self Care | Payer: Medicaid Other | Source: Ambulatory Visit | Attending: Obstetrics | Admitting: Obstetrics

## 2022-05-02 ENCOUNTER — Other Ambulatory Visit: Payer: Self-pay | Admitting: Obstetrics

## 2022-05-02 DIAGNOSIS — N6320 Unspecified lump in the left breast, unspecified quadrant: Secondary | ICD-10-CM

## 2022-05-02 DIAGNOSIS — R928 Other abnormal and inconclusive findings on diagnostic imaging of breast: Secondary | ICD-10-CM

## 2022-05-02 DIAGNOSIS — N6012 Diffuse cystic mastopathy of left breast: Secondary | ICD-10-CM | POA: Diagnosis not present

## 2022-08-01 ENCOUNTER — Encounter: Payer: Self-pay | Admitting: Internal Medicine

## 2022-08-01 ENCOUNTER — Ambulatory Visit (INDEPENDENT_AMBULATORY_CARE_PROVIDER_SITE_OTHER): Payer: Medicaid Other | Admitting: Internal Medicine

## 2022-08-01 VITALS — BP 110/68 | HR 72 | Ht 63.0 in | Wt 122.0 lb

## 2022-08-01 DIAGNOSIS — E0590 Thyrotoxicosis, unspecified without thyrotoxic crisis or storm: Secondary | ICD-10-CM

## 2022-08-01 LAB — TSH: TSH: 0.36 u[IU]/mL (ref 0.35–5.50)

## 2022-08-01 LAB — T4, FREE: Free T4: 0.76 ng/dL (ref 0.60–1.60)

## 2022-08-01 NOTE — Progress Notes (Signed)
Name: Holly Hutchinson  MRN/ DOB: [id number], [birth year]    Age/ Sex: 41 y.o., female    PCP: Shelly Bombard, MD   Reason for Endocrinology Evaluation: Low TSH     Date of Initial Endocrinology Evaluation: 03/16/2022    HPI: Holly Hutchinson is a 41 y.o. female with unremarkable past medical history. The patient presented for initial endocrinology clinic visit on 03/16/2022  for consultative assistance with her low TSH.   The patient has a history of low TSH dating back to 2010.  She has hx of local neck swelling , she had a left prominent nodule in ~2020 but this has decreased to sub-centimeter measurement  TRAb undetectable Thyroid ultrasound on 03/21/2022 revealed a solitary left subcentimeter thyroid nodule  Maternal and paternal aunts with thyroid disease     SUBJECTIVE:    Today (08/01/22):  Holly Hutchinson is here for a follow up on subclinical hyperthyroidism.  Weight has been overall stable  Denies loose stools or diarrhea No palpitations  No recent hand tremors  Anxiety is improving  She takes natural supplements and attributes the decrease in size of thyroid nodule to it   HISTORY:  Past Medical History: No past medical history on file. Past Surgical History:  Past Surgical History:  Procedure Laterality Date   ECTOPIC PREGNANCY SURGERY      Social History:  reports that she quit smoking about 2 years ago. Her smoking use included cigarettes. She smoked an average of .5 packs per day. She has quit using smokeless tobacco. She reports that she does not currently use alcohol. She reports that she does not use drugs. Family History: family history includes Breast cancer in her maternal grandmother.   HOME MEDICATIONS: Allergies as of 08/01/2022       Reactions   Erythromycin Hives, Swelling        Medication List        Accurate as of August 01, 2022  9:19 AM. If you have any questions, ask your nurse or doctor.          albuterol 108 (90  Base) MCG/ACT inhaler Commonly known as: VENTOLIN HFA Inhale 1-2 puffs into the lungs every 6 (six) hours as needed for wheezing or shortness of breath.   ibuprofen 800 MG tablet Commonly known as: ADVIL Take 1 tablet (800 mg total) by mouth every 8 (eight) hours as needed.   Lo Loestrin Fe 1 MG-10 MCG / 10 MCG tablet Generic drug: Norethindrone-Ethinyl Estradiol-Fe Biphas Take 1 tablet by mouth daily. Start taking pills on the 1st day of period.   Prenate Mini 18-0.6-0.4-350 MG Caps TAKE 1 CAPSULE BY MOUTH DAILY BEFORE BREAKFAST          REVIEW OF SYSTEMS: A comprehensive ROS was conducted with the patient and is negative except as per HPI    OBJECTIVE:  VS: BP 110/68 (BP Location: Left Arm, Patient Position: Sitting, Cuff Size: Small)   Pulse 72   Ht '5\' 3"'$  (1.6 m)   Wt 122 lb (55.3 kg)   BMI 21.61 kg/m    Wt Readings from Last 3 Encounters:  08/01/22 122 lb (55.3 kg)  03/16/22 118 lb 9.6 oz (53.8 kg)  09/07/21 123 lb (55.8 kg)     EXAM: General: Pt appears well and is in NAD  Eyes: External eye exam normal without stare, lid lag or exophthalmos.  EOM intact.  PERRL.  Neck: General: Supple without adenopathy. Thyroid: Thyroid size normal.  No  goiter or nodules appreciated.   Lungs: Clear with good BS bilat with no rales, rhonchi, or wheezes  Heart: Auscultation: RRR.  Abdomen: Normoactive bowel sounds, soft, nontender, without masses or organomegaly palpable  Extremities:  BL LE: No pretibial edema normal ROM and strength.  Mental Status: Judgment, insight: Intact Orientation: Oriented to time, place, and person Mood and affect: No depression, anxiety, or agitation     DATA REVIEWED:  Latest Reference Range & Units 08/01/22 09:40  TSH 0.35 - 5.50 uIU/mL 0.36  T4,Free(Direct) 0.60 - 1.60 ng/dL 0.76      Thyroid ultrasound 03/21/2022   Estimated total number of nodules >/= 1 cm: 0   Number of spongiform nodules >/=  2 cm not described below (TR1): 0    Number of mixed cystic and solid nodules >/= 1.5 cm not described below ([place]): 0   _________________________________________________________   Nodule # 1:   Location: Left; Mid   Maximum size: 0.6 cm; Other 2 dimensions: 0.6 x 0.6 cm   Composition: solid/almost completely solid (2)   Echogenicity: hypoechoic (2)   Shape: not taller-than-wide (0)   Margins: smooth (0)   Echogenic foci: none (0)   ACR TI-RADS total points: 4.   ACR TI-RADS risk category: TR4 (4-6 points).   ACR TI-RADS recommendations:   Given size (<0.9 cm) and appearance, this nodule does NOT meet TI-RADS criteria for biopsy or dedicated follow-up.   _________________________________________________________   No cervical lymphadenopathy.   IMPRESSION: 1. Moderately enlarged thyroid gland with normal sonographic appearance of the thyroid parenchyma. 2. Solitary nodule in the left mid thyroid (0.6 cm) which appears benign and does not meet criteria (TI-RADS category 4) for additional ultrasound follow-up or tissue sampling.   ASSESSMENT/PLAN/RECOMMENDATIONS:   Subclinical Hyperthyroidism  -Patient is clinically euthyroid -No local neck symptoms -Suspect autonomous nodule  - She is NOT keen on any medications if not necessary, she uses natural supplements that I am not familiar with  -Thyroid ultrasound showed sub-centimeter left thyroid nodule that did not require any follow up  -Repeat TFTs are normal  Follow-up 6 months   Signed electronically by: Mack Guise, MD  [facility] Endocrinology  [facility] [address] [place], [place] [address] Phone: [phone] FAX: [phone]   CC: Shelly Bombard, MD [address] [place] [address] Phone: [phone] Fax: [phone]   Return to Endocrinology clinic as below: No future appointments.

## 2022-08-02 LAB — T3: T3, Total: 109 ng/dL (ref 76–181)

## 2022-09-18 ENCOUNTER — Other Ambulatory Visit: Payer: Self-pay | Admitting: Obstetrics

## 2022-09-18 DIAGNOSIS — Z1231 Encounter for screening mammogram for malignant neoplasm of breast: Secondary | ICD-10-CM

## 2022-10-03 ENCOUNTER — Other Ambulatory Visit (HOSPITAL_COMMUNITY)
Admission: RE | Admit: 2022-10-03 | Discharge: 2022-10-03 | Disposition: A | Discharge status: Home / Self Care | Payer: Medicaid Other | Source: Ambulatory Visit | Attending: Obstetrics | Admitting: Obstetrics

## 2022-10-03 ENCOUNTER — Encounter: Payer: Self-pay | Admitting: Obstetrics

## 2022-10-03 ENCOUNTER — Ambulatory Visit (INDEPENDENT_AMBULATORY_CARE_PROVIDER_SITE_OTHER): Payer: Medicaid Other | Admitting: Obstetrics

## 2022-10-03 VITALS — BP 117/72 | HR 84 | Ht 63.0 in | Wt 123.0 lb

## 2022-10-03 DIAGNOSIS — M791 Myalgia, unspecified site: Secondary | ICD-10-CM | POA: Diagnosis not present

## 2022-10-03 DIAGNOSIS — Z113 Encounter for screening for infections with a predominantly sexual mode of transmission: Secondary | ICD-10-CM | POA: Diagnosis not present

## 2022-10-03 DIAGNOSIS — Z3041 Encounter for surveillance of contraceptive pills: Secondary | ICD-10-CM | POA: Diagnosis not present

## 2022-10-03 DIAGNOSIS — Z01419 Encounter for gynecological examination (general) (routine) without abnormal findings: Secondary | ICD-10-CM | POA: Diagnosis not present

## 2022-10-03 DIAGNOSIS — Z0000 Encounter for general adult medical examination without abnormal findings: Secondary | ICD-10-CM | POA: Diagnosis not present

## 2022-10-03 DIAGNOSIS — N898 Other specified noninflammatory disorders of vagina: Secondary | ICD-10-CM | POA: Insufficient documentation

## 2022-10-03 MED ORDER — PRENATE MINI 18-0.6-0.4-350 MG PO CAPS: ORAL_CAPSULE | ORAL | 11 refills | Status: DC

## 2022-10-03 MED ORDER — LO LOESTRIN FE 1 MG-10 MCG / 10 MCG PO TABS: 1.0000 | ORAL_TABLET | Freq: Every day | ORAL | 4 refills | Status: DC

## 2022-10-03 MED ORDER — IBUPROFEN 800 MG PO TABS: 800.0000 mg | ORAL_TABLET | Freq: Three times a day (TID) | ORAL | 5 refills | Status: DC | PRN

## 2022-10-03 NOTE — Progress Notes (Signed)
41 y.o GYN presents for AEX/PAP/STD screening. Reports no problems today.

## 2022-10-03 NOTE — Progress Notes (Signed)
Subjective:        Holly Hutchinson is a 41 y.o. female here for a routine exam.  Current complaints:  Vaginal discharge.  Also has muscle aches.  Personal health questionnaire:  Is patient Holly Hutchinson, have a family history of breast and/or ovarian cancer: no Is there a family history of uterine cancer diagnosed at age < 24, gastrointestinal cancer, urinary tract cancer, family member who is a Field seismologist syndrome-associated carrier: no Is the patient overweight and hypertensive, family history of diabetes, personal history of gestational diabetes, preeclampsia or PCOS: no Is patient over 32, have PCOS,  family history of premature CHD under age 81, diabetes, smoke, have hypertension or peripheral artery disease:  no At any time, has a partner hit, kicked or otherwise hurt or frightened you?: no Over the past 2 weeks, have you felt down, depressed or hopeless?: no Over the past 2 weeks, have you felt little interest or pleasure in doing things?:no   Gynecologic History Patient's last menstrual period was 09/07/2022 (exact date). Contraception: OCP (estrogen/progesterone) Last Pap: 2022. Results were: ASCUS with positive High Risk HPV Last mammogram: 05-17-22. Results were: abnormal  Obstetric History OB History  Gravida Para Term Preterm AB Living  '6       2 4  '$ SAB IAB Ectopic Multiple Live Births    1 1        # Outcome Date GA Lbr Len/2nd Weight Sex Delivery Anes PTL Lv  6 Gravida           5 Gravida           4 Gravida           3 Gravida           2 IAB           1 Ectopic             History reviewed. No pertinent past medical history.  Past Surgical History:  Procedure Laterality Date   ECTOPIC PREGNANCY SURGERY       Current Outpatient Medications:    albuterol (VENTOLIN HFA) 108 (90 Base) MCG/ACT inhaler, Inhale 1-2 puffs into the lungs every 6 (six) hours as needed for wheezing or shortness of breath., Disp: 1 each, Rfl: 0   ibuprofen (ADVIL) 800 MG tablet,  Take 1 tablet (800 mg total) by mouth every 8 (eight) hours as needed., Disp: 30 tablet, Rfl: 5   LO LOESTRIN FE 1 MG-10 MCG / 10 MCG tablet, Take 1 tablet by mouth daily. Start taking pills on the 1st day of period., Disp: 84 tablet, Rfl: 4   Prenat-FeCbn-FeAsp-Meth-FA-DHA (PRENATE MINI) 18-0.6-0.4-350 MG CAPS, TAKE 1 CAPSULE BY MOUTH DAILY BEFORE BREAKFAST, Disp: 30 capsule, Rfl: 11 Allergies  Allergen Reactions   Erythromycin Hives and Swelling    Social History   Tobacco Use   Smoking status: Former    Packs/day: 0.50    Types: Cigarettes    Quit date: 06/2020    Years since quitting: 2.2   Smokeless tobacco: Former  Substance Use Topics   Alcohol use: Yes    Family History  Problem Relation Age of Onset   Breast cancer Maternal Grandmother       Review of Systems  Constitutional: negative for fatigue and weight loss Respiratory: negative for cough and wheezing Cardiovascular: negative for chest pain, fatigue and palpitations Gastrointestinal: negative for abdominal pain and change in bowel habits Musculoskeletal:negative for myalgias Neurological: negative for gait problems and tremors Behavioral/Psych: negative  for abusive relationship, depression Endocrine: negative for temperature intolerance    Genitourinary:positive for vaginal discharge.  negative for abnormal menstrual periods, genital lesions, hot flashes, sexual problems  Integument/breast: negative for breast lump, breast tenderness, nipple discharge and skin lesion(s)    Objective:       BP 117/72   Pulse 84   Ht '5\' 3"'$  (1.6 m)   Wt 123 lb (55.8 kg)   LMP 09/07/2022 (Exact Date)   BMI 21.79 kg/m  General:   Alert and no distress  Skin:   no rash or abnormalities  Lungs:   clear to auscultation bilaterally  Heart:   regular rate and rhythm, S1, S2 normal, no murmur, click, rub or gallop  Breasts:   normal without suspicious masses, skin or nipple changes or axillary nodes  Abdomen:  normal findings:  no organomegaly, soft, non-tender and no hernia  Pelvis:  External genitalia: normal general appearance Urinary system: urethral meatus normal and bladder without fullness, nontender Vaginal: normal without tenderness, induration or masses Cervix: normal appearance Adnexa: normal bimanual exam Uterus: anteverted and non-tender, normal size   Lab Review Urine pregnancy test Labs reviewed yes Radiologic studies reviewed yes  I have spent a total of 30 minutes of face-to-face and non-face-to-face time, excluding clinical staff time, reviewing notes and preparing to see patient, ordering tests and/or medications, and counseling the patient.   Assessment:    1. Encounter for routine gynecological examination with Papanicolaou smear of cervix Rx: - Cytology - PAP( [place])  2. Vaginal discharge Rx: - Cervicovaginal ancillary only( [place])  3. Screening for STD (sexually transmitted disease) Rx: - HIV antibody (with reflex) - Hepatitis C Antibody - Hepatitis B Surface AntiGEN - RPR  4. Myalgia Rx: - ibuprofen (ADVIL) 800 MG tablet; Take 1 tablet (800 mg total) by mouth every 8 (eight) hours as needed.  Dispense: 30 tablet; Refill: 5  5. Routine adult health maintenance Rx: - Prenat-FeCbn-FeAsp-Meth-FA-DHA (PRENATE MINI) 18-0.6-0.4-350 MG CAPS; TAKE 1 CAPSULE BY MOUTH DAILY BEFORE BREAKFAST  Dispense: 30 capsule; Refill: 11  6. Encounter for surveillance of contraceptive pills Rx: - LO LOESTRIN FE 1 MG-10 MCG / 10 MCG tablet; Take 1 tablet by mouth daily. Start taking pills on the 1st day of period.  Dispense: 84 tablet; Refill: 4     Plan:    Education reviewed: calcium supplements, depression evaluation, low fat, low cholesterol diet, safe sex/STD prevention, self breast exams, and weight bearing exercise. Contraception: OCP (estrogen/progesterone). Follow up in: 1 year.   Meds ordered this encounter  Medications   Prenat-FeCbn-FeAsp-Meth-FA-DHA (PRENATE MINI)  18-0.6-0.4-350 MG CAPS    Sig: TAKE 1 CAPSULE BY MOUTH DAILY BEFORE BREAKFAST    Dispense:  30 capsule    Refill:  11   LO LOESTRIN FE 1 MG-10 MCG / 10 MCG tablet    Sig: Take 1 tablet by mouth daily. Start taking pills on the 1st day of period.    Dispense:  84 tablet    Refill:  4    Submit other coverage code 3  BIN:  [id number]  PCN:  [id number]   GRP:  [id number]   ID:  [id number]   ibuprofen (ADVIL) 800 MG tablet    Sig: Take 1 tablet (800 mg total) by mouth every 8 (eight) hours as needed.    Dispense:  30 tablet    Refill:  5   Orders Placed This Encounter  Procedures   HIV antibody (with reflex)   Hepatitis C  Antibody   Hepatitis B Surface AntiGEN   RPR     Shelly Bombard, MD 10/03/2022 10:10 AM

## 2022-10-04 LAB — CERVICOVAGINAL ANCILLARY ONLY
Bacterial Vaginitis (gardnerella): POSITIVE — AB
Candida Glabrata: NEGATIVE
Candida Vaginitis: NEGATIVE
Chlamydia: NEGATIVE
Comment: NEGATIVE
Comment: NEGATIVE
Comment: NEGATIVE
Comment: NEGATIVE
Comment: NEGATIVE
Comment: NORMAL
Neisseria Gonorrhea: NEGATIVE
Trichomonas: NEGATIVE

## 2022-10-04 LAB — HIV ANTIBODY (ROUTINE TESTING W REFLEX): HIV Screen 4th Generation wRfx: NONREACTIVE

## 2022-10-04 LAB — RPR: RPR Ser Ql: NONREACTIVE

## 2022-10-04 LAB — HEPATITIS C ANTIBODY: Hep C Virus Ab: NONREACTIVE

## 2022-10-04 LAB — HEPATITIS B SURFACE ANTIGEN: Hepatitis B Surface Ag: NEGATIVE

## 2022-10-05 ENCOUNTER — Other Ambulatory Visit: Payer: Self-pay | Admitting: Obstetrics

## 2022-10-05 DIAGNOSIS — N760 Acute vaginitis: Secondary | ICD-10-CM

## 2022-10-05 MED ORDER — METRONIDAZOLE 500 MG PO TABS: 500.0000 mg | ORAL_TABLET | Freq: Two times a day (BID) | ORAL | 2 refills | Status: DC

## 2022-10-08 LAB — CYTOLOGY - PAP
Comment: NEGATIVE
Diagnosis: NEGATIVE
High risk HPV: NEGATIVE

## 2022-10-11 ENCOUNTER — Ambulatory Visit
Admission: RE | Admit: 2022-10-11 | Discharge: 2022-10-11 | Disposition: A | Discharge status: Home / Self Care | Payer: Medicaid Other | Source: Ambulatory Visit | Attending: Obstetrics | Admitting: Obstetrics

## 2022-10-11 DIAGNOSIS — Z1231 Encounter for screening mammogram for malignant neoplasm of breast: Secondary | ICD-10-CM

## 2022-10-16 ENCOUNTER — Other Ambulatory Visit: Payer: Self-pay | Admitting: Obstetrics

## 2022-10-16 DIAGNOSIS — R928 Other abnormal and inconclusive findings on diagnostic imaging of breast: Secondary | ICD-10-CM

## 2022-10-31 ENCOUNTER — Ambulatory Visit
Admission: RE | Admit: 2022-10-31 | Discharge: 2022-10-31 | Disposition: A | Discharge status: Home / Self Care | Payer: Medicaid Other | Source: Ambulatory Visit | Attending: Obstetrics | Admitting: Obstetrics

## 2022-10-31 ENCOUNTER — Other Ambulatory Visit: Payer: Self-pay | Admitting: Obstetrics

## 2022-10-31 DIAGNOSIS — R928 Other abnormal and inconclusive findings on diagnostic imaging of breast: Secondary | ICD-10-CM

## 2023-02-04 ENCOUNTER — Encounter: Payer: Self-pay | Admitting: Internal Medicine

## 2023-02-04 ENCOUNTER — Ambulatory Visit (INDEPENDENT_AMBULATORY_CARE_PROVIDER_SITE_OTHER): Payer: Medicaid Other | Admitting: Internal Medicine

## 2023-02-04 ENCOUNTER — Other Ambulatory Visit: Payer: Self-pay

## 2023-02-04 VITALS — BP 138/88 | HR 58 | Ht 63.0 in | Wt 122.0 lb

## 2023-02-04 DIAGNOSIS — E0590 Thyrotoxicosis, unspecified without thyrotoxic crisis or storm: Secondary | ICD-10-CM

## 2023-02-04 LAB — T4, FREE: Free T4: 0.56 ng/dL — ABNORMAL LOW (ref 0.60–1.60)

## 2023-02-04 LAB — TSH: TSH: 0.41 u[IU]/mL (ref 0.35–5.50)

## 2023-02-04 NOTE — Progress Notes (Signed)
Name: Holly Hutchinson  MRN/ DOB: [id number], [birth year]    Age/ Sex: 42 y.o., female    PCP: Shelly Bombard, MD   Reason for Endocrinology Evaluation: Low TSH     Date of Initial Endocrinology Evaluation: 03/16/2022    HPI: [name] is a 42 y.o. female with unremarkable past medical history. The patient presented for initial endocrinology clinic visit on 03/16/2022  for consultative assistance with her low TSH.   The patient has a history of low TSH dating back to 2010.  She has hx of local neck swelling , she had a left prominent nodule in ~2020 but this has decreased to sub-centimeter measurement  TRAb undetectable Thyroid ultrasound on 03/21/2022 revealed a solitary left subcentimeter thyroid nodule  Maternal and paternal aunts with thyroid disease     SUBJECTIVE:    Today (02/04/23):  [name] is here for a follow up on subclinical hyperthyroidism.  Weight continues to be stable   Denies constipation  or diarrhea No palpitations  No hand tremors  Anxiety is improving  She takes natural supplements and attributes the decrease in size of thyroid nodule to it     HISTORY:  Past Medical History: No past medical history on file. Past Surgical History:  Past Surgical History:  Procedure Laterality Date   ECTOPIC PREGNANCY SURGERY      Social History:  reports that she quit smoking about 2 years ago. Her smoking use included cigarettes. She smoked an average of .5 packs per day. She has quit using smokeless tobacco. She reports current alcohol use. She reports that she does not use drugs. Family History: family history includes Breast cancer in her maternal grandmother.   HOME MEDICATIONS: Allergies as of 02/04/2023       Reactions   Erythromycin Hives, Swelling        Medication List        Accurate as of February 04, 2023  1:52 PM. If you have any questions, ask your nurse or doctor.          albuterol 108 (90 Base) MCG/ACT  inhaler Commonly known as: VENTOLIN HFA Inhale 1-2 puffs into the lungs every 6 (six) hours as needed for wheezing or shortness of breath.   ibuprofen 800 MG tablet Commonly known as: ADVIL Take 1 tablet (800 mg total) by mouth every 8 (eight) hours as needed.   Lo Loestrin Fe 1 MG-10 MCG / 10 MCG tablet Generic drug: Norethindrone-Ethinyl Estradiol-Fe Biphas Take 1 tablet by mouth daily. Start taking pills on the 1st day of period.   metroNIDAZOLE 500 MG tablet Commonly known as: FLAGYL Take 1 tablet (500 mg total) by mouth 2 (two) times daily.   Prenate Mini 18-0.6-0.4-350 MG Caps TAKE 1 CAPSULE BY MOUTH DAILY BEFORE BREAKFAST          REVIEW OF SYSTEMS: A comprehensive ROS was conducted with the patient and is negative except as per HPI    OBJECTIVE:  VS: BP 138/88 (BP Location: Left Arm, Patient Position: Sitting, Cuff Size: Normal)   Pulse (!) 58   Ht 5' 3"$  (1.6 m)   Wt 122 lb (55.3 kg)   SpO2 98%   BMI 21.61 kg/m    Wt Readings from Last 3 Encounters:  02/04/23 122 lb (55.3 kg)  10/03/22 123 lb (55.8 kg)  08/01/22 122 lb (55.3 kg)     EXAM: General: Pt appears well and is in NAD  Eyes: External eye exam normal  without stare, lid lag or exophthalmos.  EOM intact.   Neck: General: Supple without adenopathy. Thyroid: Thyroid size normal.    Lungs: Clear with good BS bilat   Heart: Auscultation: RRR.  Abdomen: soft, nontender  Extremities:  BL LE: No pretibial edema   Mental Status: Judgment, insight: Intact Orientation: Oriented to time, place, and person Mood and affect: No depression, anxiety, or agitation     DATA REVIEWED:  Latest Reference Range & Units 02/04/23 09:19  TSH 0.35 - 5.50 uIU/mL 0.41  T4,Free(Direct) 0.60 - 1.60 ng/dL 0.56 (L)  (L): Data is abnormally low  Thyroid ultrasound 03/21/2022   Estimated total number of nodules >/= 1 cm: 0   Number of spongiform nodules >/=  2 cm not described below (TR1): 0   Number of mixed  cystic and solid nodules >/= 1.5 cm not described below ([place]): 0   _________________________________________________________   Nodule # 1:   Location: Left; Mid   Maximum size: 0.6 cm; Other 2 dimensions: 0.6 x 0.6 cm   Composition: solid/almost completely solid (2)   Echogenicity: hypoechoic (2)   Shape: not taller-than-wide (0)   Margins: smooth (0)   Echogenic foci: none (0)   ACR TI-RADS total points: 4.   ACR TI-RADS risk category: TR4 (4-6 points).   ACR TI-RADS recommendations:   Given size (<0.9 cm) and appearance, this nodule does NOT meet TI-RADS criteria for biopsy or dedicated follow-up.   _________________________________________________________   No cervical lymphadenopathy.   IMPRESSION: 1. Moderately enlarged thyroid gland with normal sonographic appearance of the thyroid parenchyma. 2. Solitary nodule in the left mid thyroid (0.6 cm) which appears benign and does not meet criteria (TI-RADS category 4) for additional ultrasound follow-up or tissue sampling.   ASSESSMENT/PLAN/RECOMMENDATIONS:   Subclinical Hyperthyroidism  -Patient is clinically euthyroid -No local neck symptoms -Suspect autonomous nodule  - She is NOT keen on any medications if not necessary, she had used natural supplements that I am not familiar with in the past and attributes resolution to it. -Thyroid ultrasound showed sub-centimeter left thyroid nodule that did not require any follow up  -Repeat TFTs show normal TSH but free T4 is low, I suspect this is either an error or assay interference, she will have these labs to be done at [facility] in [place] in 2 months, a portal message has been sent to the patient  Follow-up 1 year   Signed electronically by: Mack Guise, MD  [facility] Endocrinology  [facility] [address] [place], [place] [address] Phone: [phone] FAX: [phone]   CC: Shelly Bombard, MD [address] [place] [address] Phone: [phone] Fax: [phone]   Return to Endocrinology clinic as below: Future Appointments  Date Time Provider [place]  05/02/2023  3:40 PM [facility] 1 [facility]  02/05/2024  7:50 AM Jadore Mcguffin, Melanie Crazier, MD [facility] None

## 2023-02-21 ENCOUNTER — Other Ambulatory Visit: Payer: Self-pay

## 2023-02-21 ENCOUNTER — Emergency Department (HOSPITAL_BASED_OUTPATIENT_CLINIC_OR_DEPARTMENT_OTHER): Payer: Medicaid Other | Admitting: Radiology

## 2023-02-21 ENCOUNTER — Emergency Department (HOSPITAL_BASED_OUTPATIENT_CLINIC_OR_DEPARTMENT_OTHER)
Admission: EM | Admit: 2023-02-21 | Discharge: 2023-02-21 | Disposition: A | Discharge status: Home / Self Care | Payer: Medicaid Other | Attending: Emergency Medicine | Admitting: Emergency Medicine

## 2023-02-21 ENCOUNTER — Encounter (HOSPITAL_BASED_OUTPATIENT_CLINIC_OR_DEPARTMENT_OTHER): Payer: Self-pay | Admitting: Emergency Medicine

## 2023-02-21 DIAGNOSIS — Z87891 Personal history of nicotine dependence: Secondary | ICD-10-CM | POA: Insufficient documentation

## 2023-02-21 DIAGNOSIS — Z7951 Long term (current) use of inhaled steroids: Secondary | ICD-10-CM | POA: Insufficient documentation

## 2023-02-21 DIAGNOSIS — R062 Wheezing: Secondary | ICD-10-CM | POA: Diagnosis present

## 2023-02-21 DIAGNOSIS — J45909 Unspecified asthma, uncomplicated: Secondary | ICD-10-CM | POA: Diagnosis not present

## 2023-02-21 DIAGNOSIS — J302 Other seasonal allergic rhinitis: Secondary | ICD-10-CM | POA: Diagnosis not present

## 2023-02-21 HISTORY — DX: Bronchitis, not specified as acute or chronic: J40

## 2023-02-21 MED ORDER — CETIRIZINE HCL 10 MG PO TABS: 10.0000 mg | ORAL_TABLET | Freq: Every day | ORAL | 0 refills | Status: DC

## 2023-02-21 MED ORDER — AEROCHAMBER PLUS FLO-VU SMALL MISC
1.0000 | Freq: Once | Status: AC
  Administered 2023-02-21: 1
  Filled 2023-02-21: qty 1

## 2023-02-21 MED ORDER — FLUTICASONE PROPIONATE 50 MCG/ACT NA SUSP: 1.0000 | Freq: Every day | NASAL | 0 refills | Status: DC

## 2023-02-21 MED ORDER — ALBUTEROL SULFATE HFA 108 (90 BASE) MCG/ACT IN AERS
2.0000 | INHALATION_SPRAY | RESPIRATORY_TRACT | Status: DC | PRN
  Administered 2023-02-21: 2 via RESPIRATORY_TRACT
  Filled 2023-02-21: qty 6.7

## 2023-02-21 NOTE — ED Notes (Signed)
RT at bedside.

## 2023-02-21 NOTE — Discharge Instructions (Addendum)
He has symptoms suggestive of seasonal allergies.  Use Flonase 1 spray per nostril daily for the next month.  You may also take Zyrtec as well as prescribed.  You may use albuterol inhaler 2 puffs every 4 hours as needed for wheezing or shortness of breath.

## 2023-02-21 NOTE — ED Provider Notes (Signed)
[facility] Provider Note   CSN: [id number] Arrival date & time: 02/21/23  1200     History  Chief Complaint  Patient presents with   Wheezing    Holly Hutchinson is a 42 y.o. female.  The history is provided by the patient and medical records. No language interpreter was used.  Wheezing   42 year old female history of bronchitis, former tobacco user, presenting complaining of respiratory discomfort.  Patient report for the past 2 weeks she has had sinus congestion, occasional itchy eyes, bouts of wheezing as well and has occasional sneezing.  She noticed pollen without on her tree and felt that it may contribute to her symptoms.  She states she has similar symptoms like this whenever the season changes.  She used a rescue inhaler as needed which sometimes provide some relief.  She does not endorse any fever, productive cough, shortness of breath, or rash.  Home Medications Prior to Admission medications   Medication Sig Start Date End Date Taking? Authorizing Provider  albuterol (VENTOLIN HFA) 108 (90 Base) MCG/ACT inhaler Inhale 1-2 puffs into the lungs every 6 (six) hours as needed for wheezing or shortness of breath. 02/06/22   Eulogio Bear, NP  ibuprofen (ADVIL) 800 MG tablet Take 1 tablet (800 mg total) by mouth every 8 (eight) hours as needed. 10/03/22   Shelly Bombard, MD  LO LOESTRIN FE 1 MG-10 MCG / 10 MCG tablet Take 1 tablet by mouth daily. Start taking pills on the 1st day of period. 10/03/22   Shelly Bombard, MD  metroNIDAZOLE (FLAGYL) 500 MG tablet Take 1 tablet (500 mg total) by mouth 2 (two) times daily. 10/05/22   Shelly Bombard, MD  Prenat-FeCbn-FeAsp-Meth-FA-DHA (PRENATE MINI) 18-0.6-0.4-350 MG CAPS TAKE 1 CAPSULE BY MOUTH DAILY BEFORE BREAKFAST 10/03/22   Shelly Bombard, MD      Allergies    Erythromycin    Review of Systems   Review of Systems  Respiratory:  Positive for wheezing.   All other  systems reviewed and are negative.   Physical Exam Updated Vital Signs BP 122/72 (BP Location: Right Arm)   Pulse 75   Temp (!) 97.5 F (36.4 C) (Oral)   Resp 18   SpO2 100%  Physical Exam Vitals and nursing note reviewed.  Constitutional:      General: She is not in acute distress.    Appearance: She is well-developed.  HENT:     Head: Atraumatic.     Right Ear: Tympanic membrane normal.     Nose: Nose normal.     Mouth/Throat:     Mouth: Mucous membranes are moist.  Eyes:     Conjunctiva/sclera: Conjunctivae normal.  Cardiovascular:     Rate and Rhythm: Normal rate and regular rhythm.  Pulmonary:     Effort: Pulmonary effort is normal.     Breath sounds: No wheezing, rhonchi or rales.  Abdominal:     Palpations: Abdomen is soft.     Tenderness: There is no abdominal tenderness.  Musculoskeletal:     Cervical back: Neck supple.  Skin:    Findings: No rash.  Neurological:     Mental Status: She is alert.  Psychiatric:        Mood and Affect: Mood normal.     ED Results / Procedures / Treatments   Labs (all labs ordered are listed, but only abnormal results are displayed) Labs Reviewed - No data to display  EKG None  Radiology DG Chest 2 View  Result Date: 02/21/2023 CLINICAL DATA:  42 year old female presents for evaluation of wheezing, history of asthma. EXAM: CHEST - 2 VIEW COMPARISON:  February 06, 2022 FINDINGS: Lungs are hyperinflated without consolidation or sign of pleural effusion. No pneumothorax. On limited assessment no sign of acute skeletal process IMPRESSION: Hyperinflation without consolidation or sign of pleural effusion. Electronically Signed   By: Zetta Bills M.D.   On: 02/21/2023 12:23    Procedures Procedures    Medications Ordered in ED Medications  albuterol (VENTOLIN HFA) 108 (90 Base) MCG/ACT inhaler 2 puff (has no administration in time range)    ED Course/ Medical Decision Making/ A&P                             Medical  Decision Making Amount and/or Complexity of Data Reviewed Radiology: ordered.   BP 122/72 (BP Location: Right Arm)   Pulse 75   Temp (!) 97.5 F (36.4 C) (Oral)   Resp 18   SpO2 100%   39:10 PM 42 year old female history of bronchitis, former tobacco user, presenting complaining of respiratory discomfort.  Patient report for the past 2 weeks she has had sinus congestion, occasional itchy eyes, bouts of wheezing as well and has occasional sneezing.  She noticed pollen without on her tree and felt that it may contribute to her symptoms.  She states she has similar symptoms like this whenever the season changes.  She used a rescue inhaler as needed which sometimes provide some relief.  She does not endorse any fever, productive cough, shortness of breath, or rash.  On exam this is a well-appearing female appears to be in no acute discomfort.  Examination of ear nose and throat overall reassuring, lungs are clear to auscultation without any obvious wheezes, rales, or rhonchi heard.  Heart with normal rate and rhythm, abdomen is soft nontender.  Vital signs reviewed and overall reassuring, no fever, no hypoxia.  DDx: Seasonal allergies, asthma, COPD, bronchitis, viral illness, pneumonia  Chest x-ray obtained independently viewed and read by me and agree with radiologist interpretation.  Fortunately chest x-ray without any evidence of focal infiltrate concerning for pneumonia.  I felt symptoms likely to be seasonal allergy and will provide supportive care.  I have low suspicion for PE, ACS, or other acute emergent medical condition.  Gave patient return precaution.  Patient given albuterol inhaler with improvement of symptoms.  Recommend using Flonase, and Zyrtec for sxs control and albuterol as needed for wheezing.           Final Clinical Impression(s) / ED Diagnoses Final diagnoses:  Seasonal allergies    Rx / DC Orders ED Discharge Orders          Ordered    cetirizine (ZYRTEC  ALLERGY) 10 MG tablet  Daily        02/21/23 1337    fluticasone (FLONASE) 50 MCG/ACT nasal spray  Daily        02/21/23 1337              Domenic Moras, PA-C 02/21/23 1339    Elgie Congo, MD 02/21/23 2014

## 2023-02-21 NOTE — ED Triage Notes (Signed)
Pt reports she has been wheezing for 2 weeks. Pt states she has been using her inhaler at home with minimal relief. Pt ambulatory to treatment room in no obvious distress.

## 2023-03-27 IMAGING — MG MM DIGITAL SCREENING BILAT W/ TOMO AND CAD
8 series · 8 of 24 positions shown · non-contrast
Comparison: None.

CLINICAL DATA: Screening. Baseline.

EXAM:
DIGITAL SCREENING BILATERAL MAMMOGRAM WITH TOMOSYNTHESIS AND CAD
TECHNIQUE: Bilateral screening digital craniocaudal and mediolateral oblique
mammograms were obtained. Bilateral screening digital breast
tomosynthesis was performed. The images were evaluated with
computer-aided detection.

[R CC synth-2D]
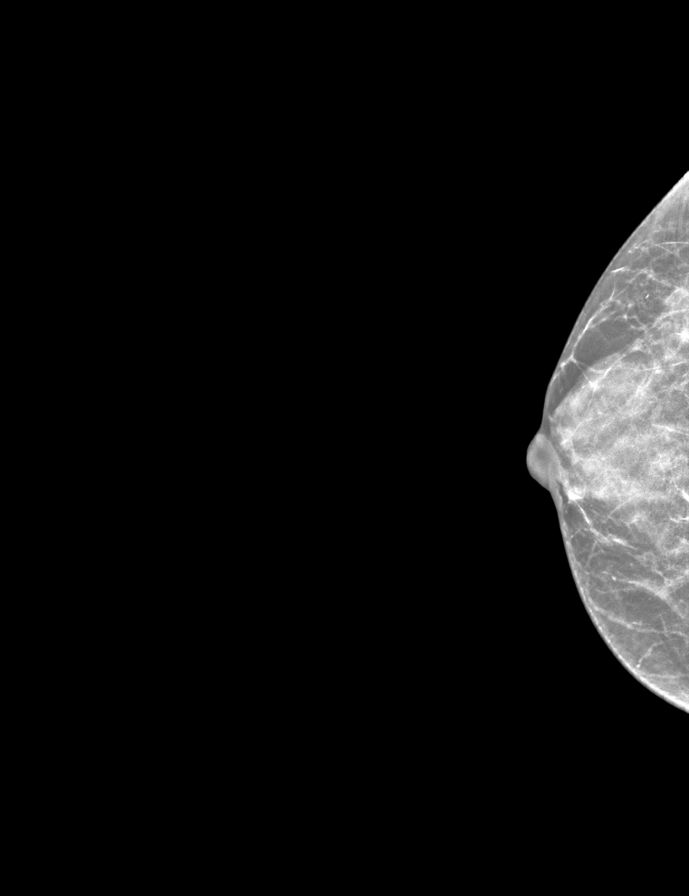

[R MLO synth-2D]
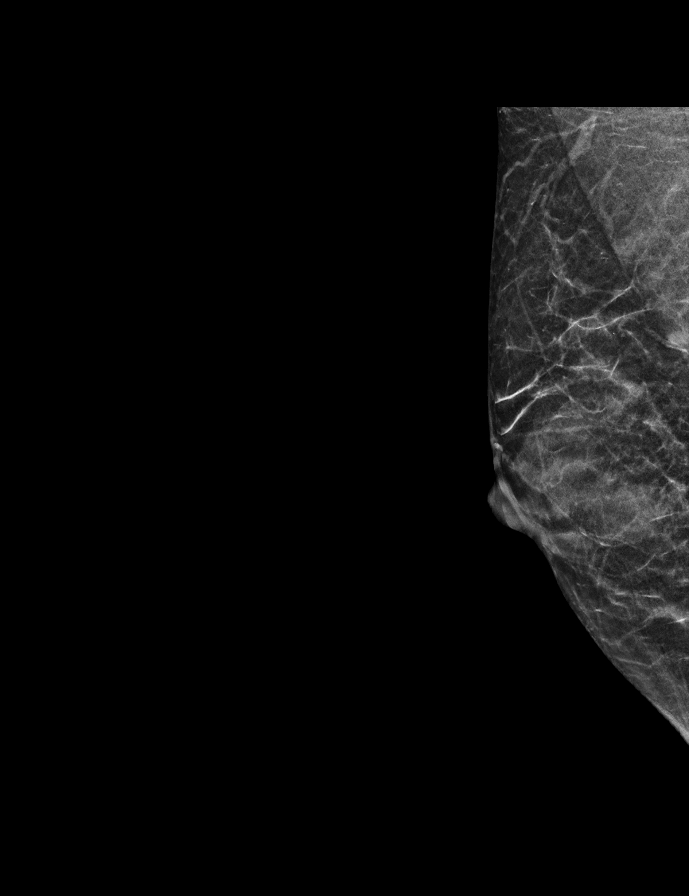

[L MLO synth-2D]
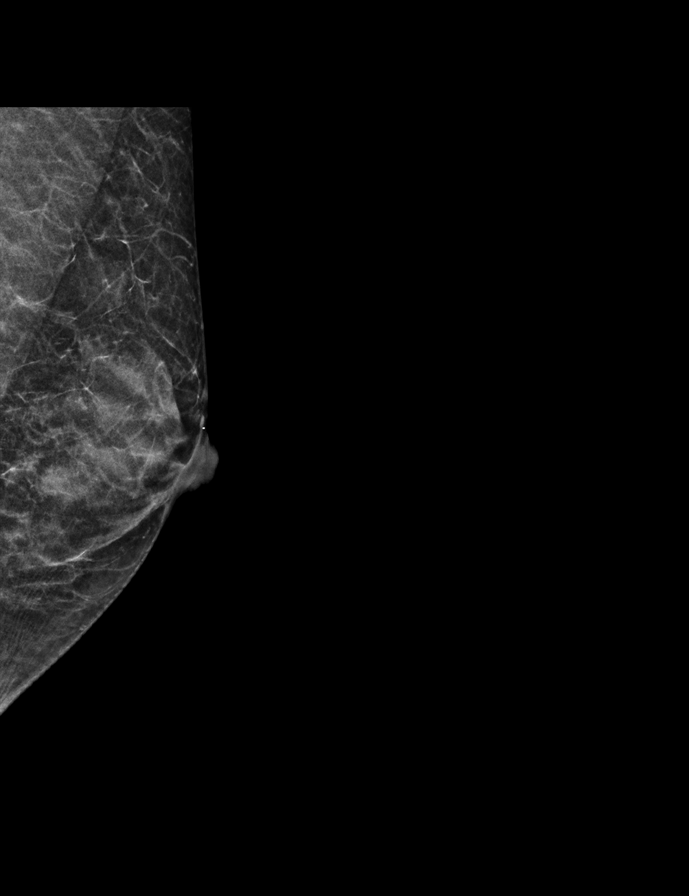

[L CC synth-2D]
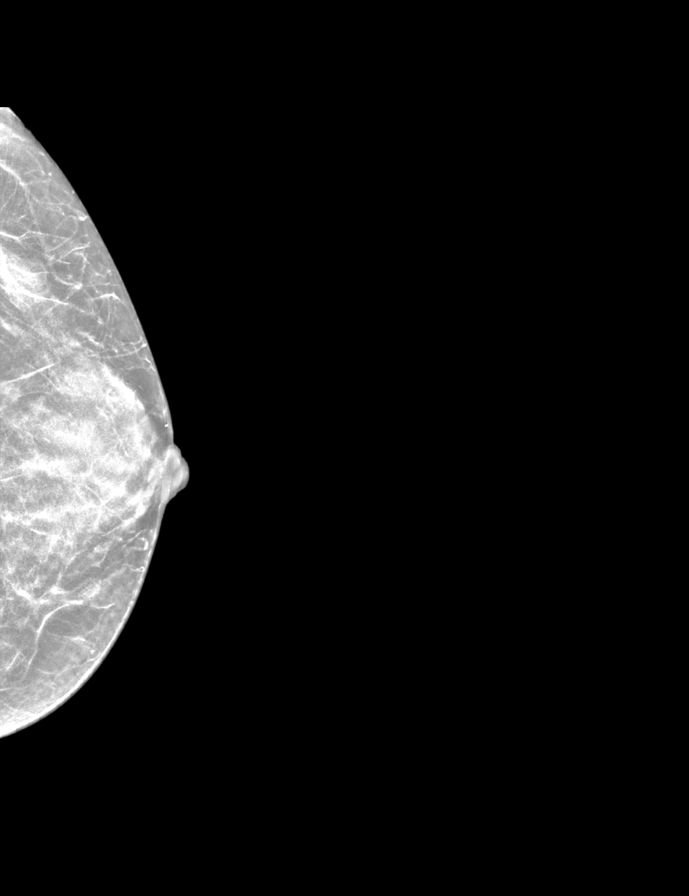

[L CC tomo · tomo slice 20/39.0]
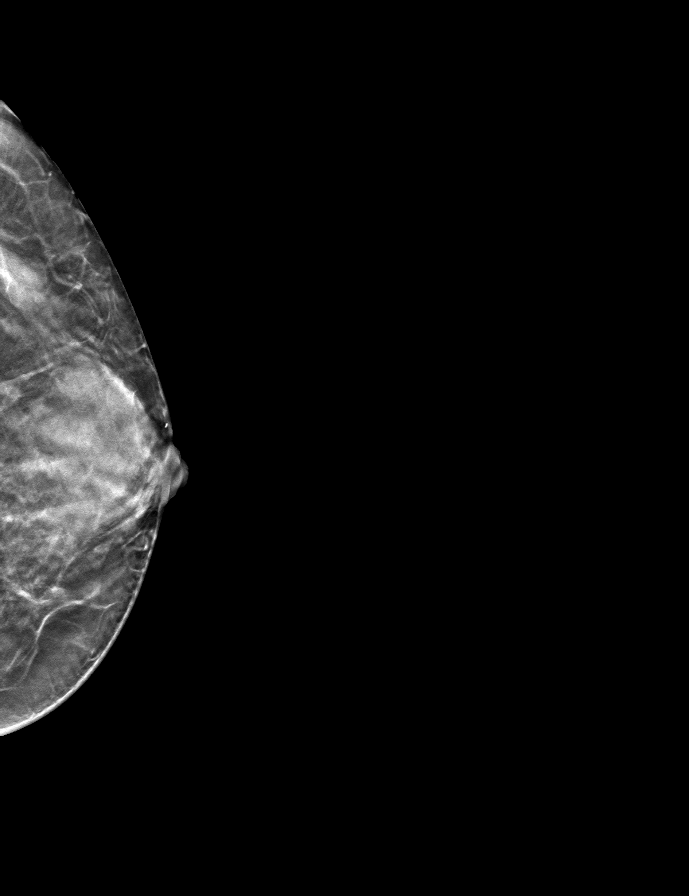

[R MLO tomo · tomo slice 19/37.0]
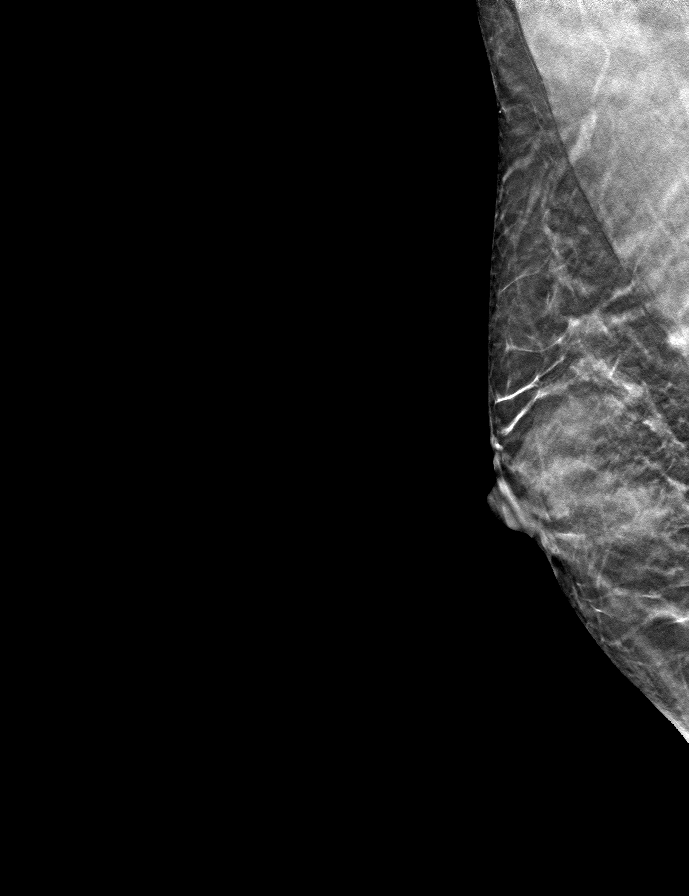

[L MLO tomo · tomo slice 19/37.0]
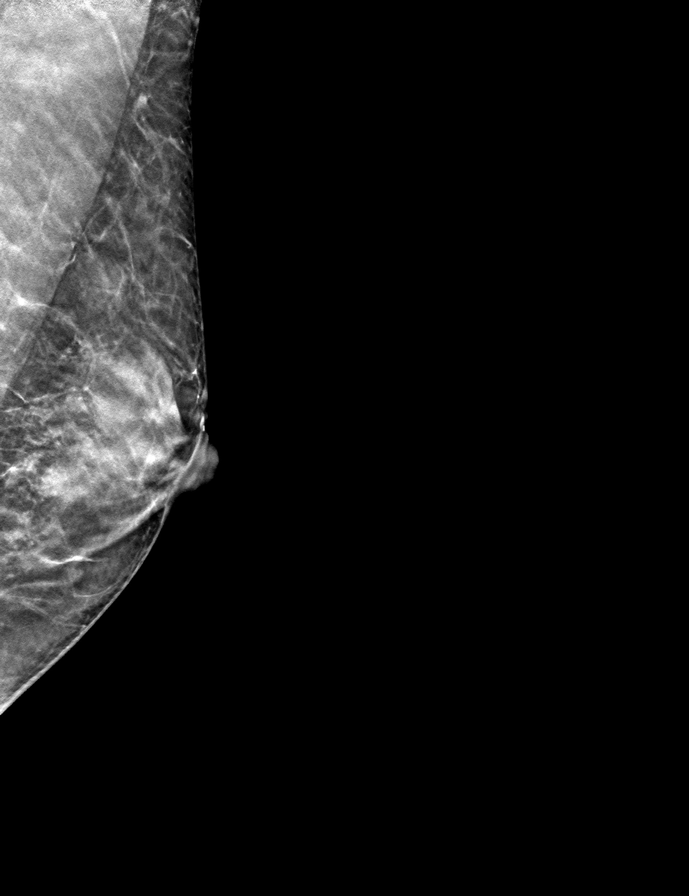

[R CC tomo · tomo slice 19/38.0]
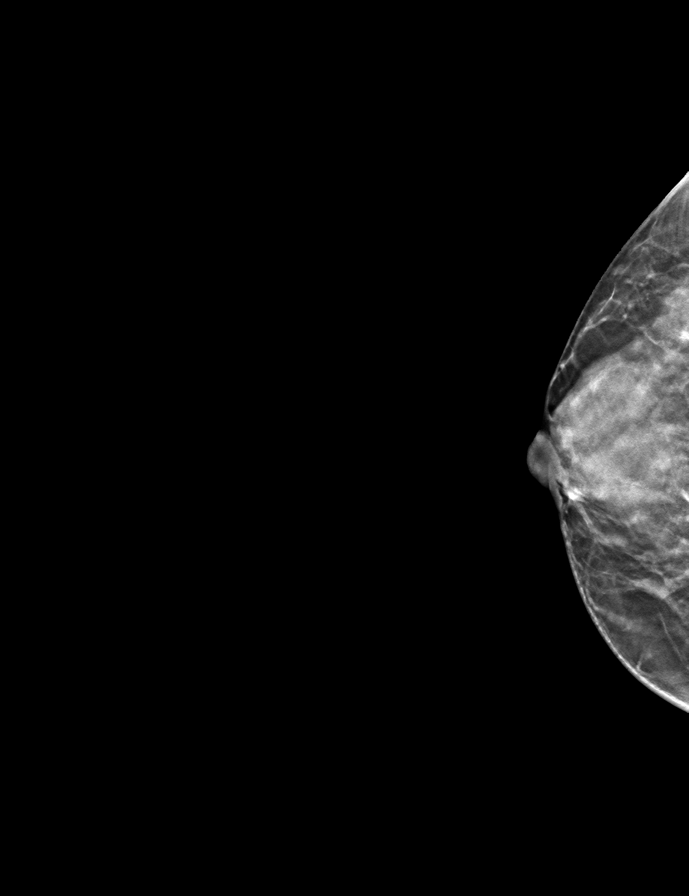

[8 of 24 positions shown; findings below may reference images not displayed]

ACR Breast Density Category c: The breast tissue is heterogeneously
dense, which may obscure small masses.
FINDINGS: In the right breast , a possible mass requires further evaluation.
This possible mass is seen within the upper RIGHT breast, at
posterior depth, MLO view only, slice 31.

In the left breast , possible asymmetry requires further evaluation.
This possible asymmetry is seen within the slightly lower LEFT
breast, at posterior depth, MLO view only, slice 21.
IMPRESSION: Further evaluation is suggested for possible mass in the right
breast. This may represent a benign lymph node.

Further evaluation is suggested for possible asymmetry in the left
breast.

RECOMMENDATION:
Diagnostic mammogram and possibly ultrasound of both breasts.
(Code:[id number])

The patient will be contacted regarding the findings, and additional
imaging will be scheduled.

BI-RADS CATEGORY  0: Incomplete. Need additional imaging evaluation
and/or prior mammograms for comparison.

## 2023-05-07 ENCOUNTER — Other Ambulatory Visit: Payer: Self-pay | Admitting: Obstetrics

## 2023-05-07 ENCOUNTER — Ambulatory Visit
Admission: RE | Admit: 2023-05-07 | Discharge: 2023-05-07 | Disposition: A | Discharge status: Home / Self Care | Payer: Medicaid Other | Source: Ambulatory Visit | Attending: Obstetrics | Admitting: Obstetrics

## 2023-05-07 DIAGNOSIS — R928 Other abnormal and inconclusive findings on diagnostic imaging of breast: Secondary | ICD-10-CM

## 2023-05-07 DIAGNOSIS — R92331 Mammographic heterogeneous density, right breast: Secondary | ICD-10-CM | POA: Diagnosis not present

## 2023-05-07 DIAGNOSIS — R921 Mammographic calcification found on diagnostic imaging of breast: Secondary | ICD-10-CM | POA: Diagnosis not present

## 2023-05-09 ENCOUNTER — Other Ambulatory Visit: Payer: Self-pay | Admitting: Obstetrics

## 2023-05-09 DIAGNOSIS — R921 Mammographic calcification found on diagnostic imaging of breast: Secondary | ICD-10-CM

## 2023-06-24 ENCOUNTER — Other Ambulatory Visit: Payer: Self-pay

## 2023-06-24 ENCOUNTER — Emergency Department (HOSPITAL_BASED_OUTPATIENT_CLINIC_OR_DEPARTMENT_OTHER): Payer: Medicaid Other

## 2023-06-24 ENCOUNTER — Emergency Department (HOSPITAL_BASED_OUTPATIENT_CLINIC_OR_DEPARTMENT_OTHER): Payer: Medicaid Other | Admitting: Radiology

## 2023-06-24 ENCOUNTER — Encounter (HOSPITAL_BASED_OUTPATIENT_CLINIC_OR_DEPARTMENT_OTHER): Payer: Self-pay | Admitting: Emergency Medicine

## 2023-06-24 ENCOUNTER — Emergency Department (HOSPITAL_BASED_OUTPATIENT_CLINIC_OR_DEPARTMENT_OTHER)
Admission: EM | Admit: 2023-06-24 | Discharge: 2023-06-24 | Disposition: A | Discharge status: Home / Self Care | Payer: Medicaid Other | Attending: Emergency Medicine | Admitting: Emergency Medicine

## 2023-06-24 DIAGNOSIS — R112 Nausea with vomiting, unspecified: Secondary | ICD-10-CM | POA: Diagnosis not present

## 2023-06-24 DIAGNOSIS — R9431 Abnormal electrocardiogram [ECG] [EKG]: Secondary | ICD-10-CM | POA: Diagnosis not present

## 2023-06-24 DIAGNOSIS — R197 Diarrhea, unspecified: Secondary | ICD-10-CM | POA: Insufficient documentation

## 2023-06-24 DIAGNOSIS — R7989 Other specified abnormal findings of blood chemistry: Secondary | ICD-10-CM | POA: Insufficient documentation

## 2023-06-24 DIAGNOSIS — R1013 Epigastric pain: Secondary | ICD-10-CM | POA: Insufficient documentation

## 2023-06-24 DIAGNOSIS — Z87891 Personal history of nicotine dependence: Secondary | ICD-10-CM | POA: Diagnosis not present

## 2023-06-24 DIAGNOSIS — R079 Chest pain, unspecified: Secondary | ICD-10-CM | POA: Diagnosis not present

## 2023-06-24 DIAGNOSIS — R1110 Vomiting, unspecified: Secondary | ICD-10-CM | POA: Diagnosis not present

## 2023-06-24 LAB — CBC WITH DIFFERENTIAL/PLATELET
Abs Immature Granulocytes: 0.01 10*3/uL (ref 0.00–0.07)
Basophils Absolute: 0 10*3/uL (ref 0.0–0.1)
Basophils Relative: 1 %
Eosinophils Absolute: 0.1 10*3/uL (ref 0.0–0.5)
Eosinophils Relative: 2 %
HCT: 36.2 % (ref 36.0–46.0)
Hemoglobin: 12 g/dL (ref 12.0–15.0)
Immature Granulocytes: 0 %
Lymphocytes Relative: 43 %
Lymphs Abs: 2.1 10*3/uL (ref 0.7–4.0)
MCH: 27 pg (ref 26.0–34.0)
MCHC: 33.1 g/dL (ref 30.0–36.0)
MCV: 81.5 fL (ref 80.0–100.0)
Monocytes Absolute: 0.5 10*3/uL (ref 0.1–1.0)
Monocytes Relative: 10 %
Neutro Abs: 2.2 10*3/uL (ref 1.7–7.7)
Neutrophils Relative %: 44 %
Platelets: 252 10*3/uL (ref 150–400)
RBC: 4.44 MIL/uL (ref 3.87–5.11)
RDW: 13.5 % (ref 11.5–15.5)
WBC: 5 10*3/uL (ref 4.0–10.5)
nRBC: 0 % (ref 0.0–0.2)

## 2023-06-24 LAB — HCG, QUANTITATIVE, PREGNANCY: hCG, Beta Chain, Quant, S: <1 m[IU]/mL (ref <5)

## 2023-06-24 LAB — COMPREHENSIVE METABOLIC PANEL
ALT: 21 U/L (ref 0–44)
AST: 38 U/L (ref 15–41)
Albumin: 4.3 g/dL (ref 3.5–5.0)
Alkaline Phosphatase: 46 U/L (ref 38–126)
Anion gap: 7 (ref 5–15)
BUN: 17 mg/dL (ref 6–20)
CO2: 24 mmol/L (ref 22–32)
Calcium: 9 mg/dL (ref 8.9–10.3)
Chloride: 104 mmol/L (ref 98–111)
Creatinine, Ser: 0.7 mg/dL (ref 0.44–1.00)
GFR, Estimated: >60 mL/min (ref >60)
Glucose, Bld: 99 mg/dL (ref 70–99)
Potassium: 4.4 mmol/L (ref 3.5–5.1)
Sodium: 135 mmol/L (ref 135–145)
Total Bilirubin: 0.6 mg/dL (ref 0.3–1.2)
Total Protein: 6.6 g/dL (ref 6.5–8.1)

## 2023-06-24 LAB — LIPASE, BLOOD: Lipase: 84 U/L — ABNORMAL HIGH (ref 11–51)

## 2023-06-24 MED ORDER — METOCLOPRAMIDE HCL 5 MG/ML IJ SOLN
10.0000 mg | Freq: Once | INTRAMUSCULAR | Status: AC
  Administered 2023-06-24: 10 mg via INTRAVENOUS
  Filled 2023-06-24: qty 2

## 2023-06-24 MED ORDER — SODIUM CHLORIDE 0.9 % IV BOLUS
1000.0000 mL | Freq: Once | INTRAVENOUS | Status: AC
  Administered 2023-06-24: 1000 mL via INTRAVENOUS

## 2023-06-24 MED ORDER — KETOROLAC TROMETHAMINE 15 MG/ML IJ SOLN
15.0000 mg | Freq: Once | INTRAMUSCULAR | Status: AC
  Administered 2023-06-24: 15 mg via INTRAVENOUS
  Filled 2023-06-24: qty 1

## 2023-06-24 MED ORDER — ONDANSETRON 4 MG PO TBDP: 4.0000 mg | ORAL_TABLET | Freq: Three times a day (TID) | ORAL | 0 refills | Status: DC | PRN

## 2023-06-24 MED ORDER — IOHEXOL 300 MG/ML  SOLN
100.0000 mL | Freq: Once | INTRAMUSCULAR | Status: AC | PRN
  Administered 2023-06-24: 65 mL via INTRAVENOUS

## 2023-06-24 MED ORDER — DIPHENHYDRAMINE HCL 50 MG/ML IJ SOLN
25.0000 mg | Freq: Once | INTRAMUSCULAR | Status: AC
  Administered 2023-06-24: 25 mg via INTRAVENOUS
  Filled 2023-06-24: qty 1

## 2023-06-24 MED ORDER — ONDANSETRON HCL 4 MG/2ML IJ SOLN
4.0000 mg | Freq: Once | INTRAMUSCULAR | Status: AC
  Administered 2023-06-24: 4 mg via INTRAVENOUS
  Filled 2023-06-24: qty 2

## 2023-06-24 NOTE — Discharge Instructions (Addendum)
Note the workup today was overall reassuring.  CT scan was without any acute abnormality.  As discussed, we will send in medicine as needed for nausea and recommend bland diet over the next few days until you are able to tolerate more complex foods.  Recommend follow-up with primary care for reassessment of your symptoms.  Please do not hesitate to return to emergency department for worrisome signs and symptoms we discussed become apparent.

## 2023-06-24 NOTE — ED Triage Notes (Addendum)
Pt arrives pov, endorses n/v that last 5 days. Emesis started again on Sat. That has resolved. Diarrhea started yesterday. Also requests to be tested for UTI

## 2023-06-24 NOTE — ED Notes (Addendum)
Provider informed of pt vomiting... Also informed of Pt already being given Zofran.Holly KitchenMarland Hutchinson

## 2023-06-24 NOTE — ED Notes (Signed)
Pt returned from CT °

## 2023-06-24 NOTE — ED Notes (Signed)
Discharge paperwork given and verbally understood. 

## 2023-06-24 NOTE — ED Notes (Signed)
Pt aware of the need for a urine preg test to perform a CT... Pt still unable to urinate... Provider informed.Marland KitchenMarland Kitchen

## 2023-06-24 NOTE — ED Notes (Signed)
Pt given food and fluids... No N/V after.Marland KitchenMarland Kitchen

## 2023-06-24 NOTE — ED Provider Notes (Signed)
[facility] AT [facility] Provider Note   CSN: [id number] Arrival date & time: 06/24/23  1028     History  Chief Complaint  Patient presents with   Diarrhea    Holly Hutchinson is a 42 y.o. female.   Diarrhea   42 year old female presents emergency department with complaints of nausea, vomiting, diarrhea.  Patient states that she has been with nausea and vomiting for the past 6 to 7 days.  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  Reports resolution of vomiting over the past 1 to 2 days but still with persistent nausea.  Patient states that yesterday, began to develop diarrhea.  Denies any fever, chills, urinary symptoms, vaginal symptoms, hematochezia/melena, hematemesis, chest pain, shortness of breath.  Has reported some abdominal pain isolated to bouts of emesis but not experienced otherwise.  Past medical history significant for bronchitis,  Home Medications Prior to Admission medications   Medication Sig Start Date End Date Taking? Authorizing Provider  ondansetron (ZOFRAN-ODT) 4 MG disintegrating tablet Take 1 tablet (4 mg total) by mouth every 8 (eight) hours as needed for nausea or vomiting. 06/24/23  Yes Sherian Maroon A, PA  albuterol (VENTOLIN HFA) 108 (90 Base) MCG/ACT inhaler Inhale 1-2 puffs into the lungs every 6 (six) hours as needed for wheezing or shortness of breath. 02/06/22   Valentino Nose, NP  cetirizine (ZYRTEC ALLERGY) 10 MG tablet Take 1 tablet (10 mg total) by mouth daily. 02/21/23   Fayrene Helper, PA-C  fluticasone (FLONASE) 50 MCG/ACT nasal spray Place 1 spray into both nostrils daily. 02/21/23   Fayrene Helper, PA-C  ibuprofen (ADVIL) 800 MG tablet Take 1 tablet (800 mg total) by mouth every 8 (eight) hours as needed. 10/03/22   Brock Bad, MD  LO LOESTRIN FE 1 MG-10 MCG / 10 MCG tablet Take 1 tablet by mouth daily. Start taking pills on the  1st day of period. 10/03/22   Brock Bad, MD  metroNIDAZOLE (FLAGYL) 500 MG tablet Take 1 tablet (500 mg total) by mouth 2 (two) times daily. 10/05/22   Brock Bad, MD  Prenat-FeCbn-FeAsp-Meth-FA-DHA (PRENATE MINI) 18-0.6-0.4-350 MG CAPS TAKE 1 CAPSULE BY MOUTH DAILY BEFORE BREAKFAST 10/03/22   Brock Bad, MD      Allergies    Erythromycin    Review of Systems   Review of Systems  Gastrointestinal:  Positive for diarrhea.  All other systems reviewed and are negative.   Physical Exam Updated Vital Signs BP 122/82 (BP Location: Right Arm)   Pulse 61   Temp 98.7 F (37.1 C) (Oral)   Resp (!) 21   Ht 5\' 3"  (1.6 m)   Wt 55.8 kg   SpO2 100%   BMI 21.79 kg/m  Physical Exam Vitals and nursing note reviewed.  Constitutional:      General: She is not in acute distress.    Appearance: She is well-developed.  HENT:     Head: Normocephalic and atraumatic.  Eyes:     Conjunctiva/sclera: Conjunctivae normal.  Cardiovascular:     Rate and Rhythm: Normal rate and regular rhythm.     Heart sounds: No murmur heard. Pulmonary:     Effort: Pulmonary effort is normal. No respiratory distress.     Breath sounds: Normal breath sounds. No wheezing, rhonchi or rales.  Abdominal:     Palpations: Abdomen is soft.     Tenderness: There is  abdominal tenderness. There is right CVA tenderness. There is no left CVA tenderness.     Comments: Mild epigastric tenderness to palpation  Musculoskeletal:        General: No swelling.     Cervical back: Neck supple.     Right lower leg: No edema.     Left lower leg: No edema.  Skin:    General: Skin is warm and dry.     Capillary Refill: Capillary refill takes less than 2 seconds.  Neurological:     Mental Status: She is alert.  Psychiatric:        Mood and Affect: Mood normal.     ED Results / Procedures / Treatments   Labs (all labs ordered are listed, but only abnormal results are displayed) Labs Reviewed  LIPASE, BLOOD  - Abnormal; Notable for the following components:      Result Value   Lipase 84 (*)    All other components within normal limits  SARS CORONAVIRUS 2 BY RT PCR  COMPREHENSIVE METABOLIC PANEL  CBC WITH DIFFERENTIAL/PLATELET  HCG, QUANTITATIVE, PREGNANCY  URINALYSIS, ROUTINE W REFLEX MICROSCOPIC    EKG None  Radiology CT ABDOMEN PELVIS W CONTRAST  Result Date: 06/24/2023 CLINICAL DATA:  Nausea and vomiting for 5 days. Diarrhea started yesterday. Concern for urinary tract infection. EXAM: CT ABDOMEN AND PELVIS WITH CONTRAST TECHNIQUE: Multidetector CT imaging of the abdomen and pelvis was performed using the standard protocol following bolus administration of intravenous contrast. RADIATION DOSE REDUCTION: This exam was performed according to the departmental dose-optimization program which includes automated exposure control, adjustment of the mA and/or kV according to patient size and/or use of iterative reconstruction technique. CONTRAST:  65mL OMNIPAQUE IOHEXOL 300 MG/ML  SOLN COMPARISON:  None Available. FINDINGS: Lower chest: No acute abnormality. Hepatobiliary: No focal liver abnormality is seen. No gallstones, gallbladder wall thickening, or biliary dilatation. Pancreas: Unremarkable. No pancreatic ductal dilatation or surrounding inflammatory changes. Spleen: Normal in size without focal abnormality. Adrenals/Urinary Tract: Adrenal glands are unremarkable. Kidneys are normal, without renal calculi, focal lesion, or hydronephrosis. Bladder is unremarkable. Stomach/Bowel: Stomach is within normal limits. Appendix appears normal. No evidence of bowel wall thickening, distention, or inflammatory changes. Vascular/Lymphatic: No significant vascular findings are present. No enlarged abdominal or pelvic lymph nodes. Reproductive: Retroverted uterus. Bilateral adnexa are unremarkable. Other: No abdominal wall hernia or abnormality. No abdominopelvic ascites. Musculoskeletal: No acute or significant  osseous findings. IMPRESSION: 1. No CT evidence of acute abdominal/pelvic process. 2. No evidence of nephrolithiasis or hydronephrosis. 3. Normal appendix. No evidence of colitis or diverticulitis. Electronically Signed   By: Larose Hires D.O.   On: 06/24/2023 14:48   DG Chest 2 View  Result Date: 06/24/2023 CLINICAL DATA:  Chest pain. Vomiting starting 2 days ago, now resolvedw. Diarrhea started yesterday. EXAM: CHEST - 2 VIEW COMPARISON:  Chest radiographs 02/21/2023 and 02/06/2022 FINDINGS: Cardiac silhouette and mediastinal contours are within normal limits. There is again flattening of the diaphragms and mild hyperinflation. The lungs are clear. No pleural effusion or pneumothorax. No acute skeletal abnormality. IMPRESSION: No active cardiopulmonary disease. Electronically Signed   By: Neita Garnet M.D.   On: 06/24/2023 11:58    Procedures Procedures    Medications Ordered in ED Medications  ondansetron (ZOFRAN) injection 4 mg (4 mg Intravenous Given 06/24/23 1109)  sodium chloride 0.9 % bolus 1,000 mL (0 mLs Intravenous Stopped 06/24/23 1559)  ketorolac (TORADOL) 15 MG/ML injection 15 mg (15 mg Intravenous Given 06/24/23 1110)  metoCLOPramide (REGLAN)  injection 10 mg (10 mg Intravenous Given 06/24/23 1221)  diphenhydrAMINE (BENADRYL) injection 25 mg (25 mg Intravenous Given 06/24/23 1219)  iohexol (OMNIPAQUE) 300 MG/ML solution 100 mL (65 mLs Intravenous Contrast Given 06/24/23 1434)    ED Course/ Medical Decision Making/ A&P                              Medical Decision Making Amount and/or Complexity of Data Reviewed Labs: ordered. Radiology: ordered.  Risk Prescription drug management.   This patient presents to the ED for concern of nausea, vomiting, diarrhea, this involves an extensive number of treatment options, and is a complaint that carries with it a high risk of complications and morbidity.  The differential diagnosis includes gastroenteritis, is, PUD, hepatitis, CBD pathology,  cholecystitis, SBO/LBO, volvulus, diverticulitis, appendicitis, cystitis, pyelonephritis, nephrolithiasis, ectopic pregnancy, ovarian torsion, tubo-ovarian abscess, PID, pregnancy   Co morbidities that complicate the patient evaluation  See HPI   Additional history obtained:  Additional history obtained from EMR External records from outside source obtained and reviewed including hospital records   Lab Tests:  I Ordered, and personally interpreted labs.  The pertinent results include: No leukocytosis.  No evidence of anemia.  Platelets within range.  No electrolyte abnormalities.  No transaminitis.  No renal dysfunction.  Lipase mildly elevated 84.  hCG negative.   Imaging Studies ordered:  I ordered imaging studies including chest x-ray, CT abdomen pelvis I independently visualized and interpreted imaging which showed  Chest x-ray: No acute cardiopulmonary abnormality. CT abdomen pelvis: No acute abnormality in abdomen or pelvis I agree with the radiologist interpretation   Cardiac Monitoring: / EKG:  The patient was maintained on a cardiac monitor.  I personally viewed and interpreted the cardiac monitored which showed an underlying rhythm of: Sinus rhythm   Consultations Obtained:  N/a   Problem List / ED Course / Critical interventions / Medication management  Nausea, vomiting, diarrhea I ordered medication including 1 L normal saline, Toradol, Benadryl, Reglan, Zofran   Reevaluation of the patient after these medicines showed that the patient improved I have reviewed the patients home medicines and have made adjustments as needed   Social Determinants of Health:  Former cigarette use.  Denies illicit drug use.   Test / Admission - Considered:  Nausea, vomiting, diarrhea Vitals signs within normal range and stable throughout visit. Laboratory/imaging studies significant for: See above 42 year old female presents emergency department with complaints of  nausea, vomiting, diarrhea.  Patient's workup today overall reassuring.  Laboratory studies significant for mild elevation of lipase 84 but without meeting clinical picture of pancreatitis with inflammatory changes around pancreas on CT examination although pain of patient is most experience in epigastric region.  CT examination was pursued given patient's persistent abdominal pain as well as breakthrough episodes of emesis despite initial antiemetic administered on the ED of which was negative for any acute intra-abdominal/intrapelvic abnormality.  Patient symptoms most likely secondary to gastrointestinal viral illness/gastroenteritis viral.  Recommend continued treatment of symptoms in the outpatient setting with Zofran to take as needed, bland diet, Imodium for persistent/excessive diarrhea as well as close follow-up with primary care for further assessment of symptoms.  Treatment plan discussed at length with patient and she knowledge understanding was agreeable to said plan.  Patient overall well-appearing, afebrile in no acute distress, tolerating p.o. without repeat bouts of emesis.  Further workup deemed unnecessary at this time while emergency department. Worrisome signs and symptoms were  discussed with the patient, and the patient acknowledged understanding to return to the ED if noticed. Patient was stable upon discharge.          Final Clinical Impression(s) / ED Diagnoses Final diagnoses:  Nausea vomiting and diarrhea    Rx / DC Orders ED Discharge Orders          Ordered    ondansetron (ZOFRAN-ODT) 4 MG disintegrating tablet  Every 8 hours PRN        06/24/23 1519              [place], [place], [place] 06/24/23 1835    Sloan Leiter, DO 06/28/23 [phone]

## 2023-06-24 NOTE — ED Notes (Signed)
Pts aware of the need for a urine... Unable to currently provide the sample.Holly KitchenMarland Hutchinson

## 2023-07-02 ENCOUNTER — Telehealth: Payer: Self-pay

## 2023-07-02 NOTE — Telephone Encounter (Signed)
Pt called requesting to speak with Dr. Clearance Coots. Advised pt Dr. Clearance Coots is out of the office and not taking messages at this time.  Pt c/o bloating, and "sickness" lasting 2 weeks. Pt was recently seen in ED for the same symptoms.  Advised pt this is out of our window of speciality and she could follow up with PCP or GI. Informed pt to hydrate adequately and eat a healthy, balanced, diet. \ Pt verbalizes understanding.

## 2023-07-03 ENCOUNTER — Ambulatory Visit (HOSPITAL_COMMUNITY)
Admission: EM | Admit: 2023-07-03 | Discharge: 2023-07-03 | Disposition: A | Discharge status: Home / Self Care | Payer: Medicaid Other | Attending: Emergency Medicine | Admitting: Emergency Medicine

## 2023-07-03 ENCOUNTER — Other Ambulatory Visit: Payer: Self-pay

## 2023-07-03 ENCOUNTER — Encounter (HOSPITAL_COMMUNITY): Payer: Self-pay | Admitting: Emergency Medicine

## 2023-07-03 DIAGNOSIS — R1084 Generalized abdominal pain: Secondary | ICD-10-CM

## 2023-07-03 DIAGNOSIS — R599 Enlarged lymph nodes, unspecified: Secondary | ICD-10-CM | POA: Diagnosis not present

## 2023-07-03 MED ORDER — OMEPRAZOLE 20 MG PO CPDR: 20.0000 mg | DELAYED_RELEASE_CAPSULE | Freq: Two times a day (BID) | ORAL | 0 refills | Status: DC

## 2023-07-03 NOTE — ED Triage Notes (Signed)
Patient has had random illness for several weeks.  Patient has had nausea and vomiting .Holly Hutchinson  Patient has felt bloated, belching, flatulence.  Patient says she feels like it is trapped gas in abdomen.  Spoke to pcp office on phone and suggested probiotics.  Has not had any medicines for symptoms.  Patient reports left side of face is puffy, but not painful

## 2023-07-03 NOTE — Discharge Instructions (Addendum)
Avoid spicy,greasy,fried foods. Take omeprazole as prescribed-call and follow up with Gi specialist(gastroenterologist),ENT(otolaryngologist) if symptoms continue and do not resolve. May take daily allergy med of choice. Avoid caffeine,alcohol and smoking,avoid stress as it will make your symptoms worse.Avoid chewing gum,chomping on ice or hard candy.   If your pain persists or worsens, or you develop fever, unable to keep meds down, then go to Er for further evaluation.

## 2023-07-03 NOTE — ED Provider Notes (Signed)
[facility]    CSN: [id number] Arrival date & time: 07/03/23  1555      History   Chief Complaint No chief complaint on file.   HPI Holly Hutchinson is a 42 y.o. female.   42 year old female pt, Holly Hutchinson, presents to urgent care with chief complaint of abdominal pain/gas after eating spicy,fried foods. Pt was recently evaluated in ER (06/24/2023)for abd pain,N,V and had negative workup inluding Ct scan w contrast. Pt has not followed up with PCP or GI specialist. Also had recent dental cleaning and now has left sided jaw discomfort"no pain" and swelling. No treatment tried for symptoms. Pt denies dental pain or fever.  Pt reports she has anxiety with daughter pregnancy, works in Teacher, music.  The history is provided by the patient. No language interpreter was used.    Past Medical History:  Diagnosis Date   Bronchitis     Patient Active Problem List   Diagnosis Date Noted   Swollen lymph nodes 07/03/2023   Generalized abdominal pain 07/03/2023    Past Surgical History:  Procedure Laterality Date   ECTOPIC PREGNANCY SURGERY      OB History     Gravida  6   Para      Term      Preterm      AB  2   Living  4      SAB      IAB  1   Ectopic  1   Multiple      Live Births               Home Medications    Prior to Admission medications   Medication Sig Start Date End Date Taking? Authorizing Provider  omeprazole (PRILOSEC) 20 MG capsule Take 1 capsule (20 mg total) by mouth 2 (two) times daily before a meal for 14 days. 07/03/23 07/17/23 Yes Donye Campanelli, Para March, NP  albuterol (VENTOLIN HFA) 108 (90 Base) MCG/ACT inhaler Inhale 1-2 puffs into the lungs every 6 (six) hours as needed for wheezing or shortness of breath. Patient not taking: Reported on 07/03/2023 02/06/22   Valentino Nose, NP  cetirizine (ZYRTEC ALLERGY) 10 MG tablet Take 1 tablet (10 mg total) by mouth daily. Patient not taking: Reported on 07/03/2023 02/21/23    Fayrene Helper, PA-C  fluticasone [facility]) 50 MCG/ACT nasal spray Place 1 spray into both nostrils daily. Patient not taking: Reported on 07/03/2023 02/21/23   Fayrene Helper, PA-C  ibuprofen (ADVIL) 800 MG tablet Take 1 tablet (800 mg total) by mouth every 8 (eight) hours as needed. Patient not taking: Reported on 07/03/2023 10/03/22   Brock Bad, MD  LO LOESTRIN FE 1 MG-10 MCG / 10 MCG tablet Take 1 tablet by mouth daily. Start taking pills on the 1st day of period. Patient not taking: Reported on 07/03/2023 10/03/22   Brock Bad, MD  metroNIDAZOLE (FLAGYL) 500 MG tablet Take 1 tablet (500 mg total) by mouth 2 (two) times daily. Patient not taking: Reported on 07/03/2023 10/05/22   Brock Bad, MD  ondansetron (ZOFRAN-ODT) 4 MG disintegrating tablet Take 1 tablet (4 mg total) by mouth every 8 (eight) hours as needed for nausea or vomiting. Patient not taking: Reported on 07/03/2023 06/24/23   Sherian Maroon A, PA  Prenat-FeCbn-FeAsp-Meth-FA-DHA (PRENATE MINI) 18-0.6-0.4-350 MG CAPS TAKE 1 CAPSULE BY MOUTH DAILY BEFORE BREAKFAST Patient not taking: Reported on 07/03/2023 10/03/22   Brock Bad, MD    Family History Family  History  Problem Relation Age of Onset   Breast cancer Maternal Grandmother        83s    Social History Social History   Tobacco Use   Smoking status: Former    Current packs/day: 0.00    Types: Cigarettes    Quit date: 06/2020    Years since quitting: 3.0   Smokeless tobacco: Former  Building services engineer status: Never Used  Substance Use Topics   Alcohol use: Yes   Drug use: No     Allergies   Erythromycin   Review of Systems Review of Systems  Constitutional:  Negative for fever.  HENT:  Positive for facial swelling.   Gastrointestinal:  Positive for abdominal pain, nausea and vomiting.  All other systems reviewed and are negative.    Physical Exam Triage Vital Signs ED Triage Vitals  Encounter Vitals Group     BP       Systolic BP Percentile      Diastolic BP Percentile      Pulse      Resp      Temp      Temp src      SpO2      Weight      Height      Head Circumference      Peak Flow      Pain Score      Pain Loc      Pain Education      Exclude from Growth Chart    No data found.  Updated Vital Signs BP 110/73   Pulse 82   Temp 98 F (36.7 C) (Oral)   Resp 18   LMP 06/16/2023 (Approximate)   SpO2 100%   Visual Acuity Right Eye Distance:   Left Eye Distance:   Bilateral Distance:    Right Eye Near:   Left Eye Near:    Bilateral Near:     Physical Exam Vitals and nursing note reviewed.  Constitutional:      Appearance: Normal appearance. She is well-developed and well-groomed.  HENT:     Head: Normocephalic.     Right Ear: Tympanic membrane is retracted.     Left Ear: Tympanic membrane is retracted.     Nose: Nose normal.     Mouth/Throat:     Lips: Pink.     Mouth: Mucous membranes are moist.     Pharynx: Oropharynx is clear.  Eyes:     General: Lids are normal. Vision grossly intact.     Extraocular Movements: Extraocular movements intact.     Pupils: Pupils are equal, round, and reactive to light.  Neck:     Trachea: Trachea normal.     Comments: Left pre/post auricle lymphadenopathy(mild) Cardiovascular:     Rate and Rhythm: Normal rate and regular rhythm.     Pulses: Normal pulses.     Heart sounds: Normal heart sounds.  Pulmonary:     Effort: Pulmonary effort is normal.     Breath sounds: Normal breath sounds.  Abdominal:     General: Bowel sounds are normal.     Palpations: Abdomen is soft.     Tenderness: There is no abdominal tenderness. There is no guarding or rebound.  Musculoskeletal:     Cervical back: Normal range of motion.  Neurological:     General: No focal deficit present.     Mental Status: She is alert and oriented to person, place, and time.     GCS:  GCS eye subscore is 4. GCS verbal subscore is 5. GCS motor subscore is 6.  Psychiatric:         Attention and Perception: Attention normal.        Mood and Affect: Mood normal.        Speech: Speech normal.        Behavior: Behavior normal. Behavior is cooperative.      UC Treatments / Results  Labs (all labs ordered are listed, but only abnormal results are displayed) Labs Reviewed - No data to display  EKG   Radiology No results found.  Procedures Procedures (including critical care time)  Medications Ordered in UC Medications - No data to display  Initial Impression / Assessment and Plan / UC Course  I have reviewed the triage vital signs and the nursing notes.  Pertinent labs & imaging results that were available during my care of the patient were reviewed by me and considered in my medical decision making (see chart for details).     Ddx: Abdominal pain,GERD,IBS, PUD, anxiety,TMJ, lymphadenopathy Final Clinical Impressions(s) / UC Diagnoses   Final diagnoses:  Swollen lymph nodes  Generalized abdominal pain     Discharge Instructions      Avoid spicy,greasy,fried foods. Take omeprazole as prescribed-call and follow up with Gi specialist(gastroenterologist),ENT(otolaryngologist) if symptoms continue and do not resolve. May take daily allergy med of choice. Avoid caffeine,alcohol and smoking,avoid stress as it will make your symptoms worse.Avoid chewing gum,chomping on ice or hard candy.   If your pain persists or worsens, or you develop fever, unable to keep meds down, then go to Er for further evaluation.      ED Prescriptions     Medication Sig Dispense Auth. Provider   omeprazole (PRILOSEC) 20 MG capsule Take 1 capsule (20 mg total) by mouth 2 (two) times daily before a meal for 14 days. 28 capsule Kelly Eisler, Para March, NP      PDMP not reviewed this encounter.   Clancy Gourd, NP 07/03/23 [phone]

## 2023-10-06 ENCOUNTER — Other Ambulatory Visit: Payer: Self-pay | Admitting: Obstetrics

## 2023-10-06 DIAGNOSIS — Z0000 Encounter for general adult medical examination without abnormal findings: Secondary | ICD-10-CM

## 2023-11-07 ENCOUNTER — Other Ambulatory Visit: Payer: Self-pay

## 2023-11-07 ENCOUNTER — Telehealth: Payer: Self-pay

## 2023-11-07 DIAGNOSIS — N760 Acute vaginitis: Secondary | ICD-10-CM

## 2023-11-07 MED ORDER — METRONIDAZOLE 500 MG PO TABS
500.0000 mg | ORAL_TABLET | Freq: Two times a day (BID) | ORAL | 0 refills | Status: DC
Start: 2023-11-07 — End: 2024-02-06

## 2023-11-07 NOTE — Telephone Encounter (Signed)
Patient called and left a message requesting medication refills.  Returned patient call. No answer. Left vm for patient to return call to the office.  Patient has not been seen since 10/03/22. Needs annual

## 2023-11-09 ENCOUNTER — Encounter (HOSPITAL_BASED_OUTPATIENT_CLINIC_OR_DEPARTMENT_OTHER): Payer: Self-pay

## 2023-11-09 ENCOUNTER — Emergency Department (HOSPITAL_BASED_OUTPATIENT_CLINIC_OR_DEPARTMENT_OTHER)
Admission: EM | Admit: 2023-11-09 | Discharge: 2023-11-09 | Disposition: A | Discharge status: Home / Self Care | Payer: Medicaid Other | Attending: Emergency Medicine | Admitting: Emergency Medicine

## 2023-11-09 DIAGNOSIS — E871 Hypo-osmolality and hyponatremia: Secondary | ICD-10-CM | POA: Diagnosis not present

## 2023-11-09 DIAGNOSIS — R112 Nausea with vomiting, unspecified: Secondary | ICD-10-CM | POA: Insufficient documentation

## 2023-11-09 DIAGNOSIS — E876 Hypokalemia: Secondary | ICD-10-CM | POA: Diagnosis not present

## 2023-11-09 LAB — CBC
HCT: 37.3 % (ref 36.0–46.0)
Hemoglobin: 12.8 g/dL (ref 12.0–15.0)
MCH: 27.1 pg (ref 26.0–34.0)
MCHC: 34.3 g/dL (ref 30.0–36.0)
MCV: 79 fL — ABNORMAL LOW (ref 80.0–100.0)
Platelets: 249 10*3/uL (ref 150–400)
RBC: 4.72 MIL/uL (ref 3.87–5.11)
RDW: 13.9 % (ref 11.5–15.5)
WBC: 10.7 10*3/uL — ABNORMAL HIGH (ref 4.0–10.5)
nRBC: 0 % (ref 0.0–0.2)

## 2023-11-09 LAB — COMPREHENSIVE METABOLIC PANEL
ALT: 32 U/L (ref 0–44)
AST: 63 U/L — ABNORMAL HIGH (ref 15–41)
Albumin: 4.9 g/dL (ref 3.5–5.0)
Alkaline Phosphatase: 61 U/L (ref 38–126)
Anion gap: 12 (ref 5–15)
BUN: 10 mg/dL (ref 6–20)
CO2: 25 mmol/L (ref 22–32)
Calcium: 9.4 mg/dL (ref 8.9–10.3)
Chloride: 99 mmol/L (ref 98–111)
Creatinine, Ser: 0.57 mg/dL (ref 0.44–1.00)
GFR, Estimated: >60 mL/min (ref >60)
Glucose, Bld: 142 mg/dL — ABNORMAL HIGH (ref 70–99)
Potassium: 3.3 mmol/L — ABNORMAL LOW (ref 3.5–5.1)
Sodium: 136 mmol/L (ref 135–145)
Total Bilirubin: 0.6 mg/dL (ref <1.2)
Total Protein: 7.9 g/dL (ref 6.5–8.1)

## 2023-11-09 LAB — LIPASE, BLOOD: Lipase: 30 U/L (ref 11–51)

## 2023-11-09 MED ORDER — POTASSIUM CHLORIDE CRYS ER 20 MEQ PO TBCR
40.0000 meq | EXTENDED_RELEASE_TABLET | Freq: Once | ORAL | Status: DC
  Filled 2023-11-09: qty 2

## 2023-11-09 MED ORDER — LIDOCAINE VISCOUS HCL 2 % MT SOLN
15.0000 mL | Freq: Once | OROMUCOSAL | Status: DC
  Filled 2023-11-09: qty 15

## 2023-11-09 MED ORDER — ALUM & MAG HYDROXIDE-SIMETH 200-200-20 MG/5ML PO SUSP
30.0000 mL | Freq: Once | ORAL | Status: DC
  Filled 2023-11-09: qty 30

## 2023-11-09 MED ORDER — ONDANSETRON HCL 4 MG/2ML IJ SOLN
4.0000 mg | Freq: Once | INTRAMUSCULAR | Status: AC
  Administered 2023-11-09: 4 mg via INTRAVENOUS
  Filled 2023-11-09: qty 2

## 2023-11-09 MED ORDER — ONDANSETRON 4 MG PO TBDP: 4.0000 mg | ORAL_TABLET | Freq: Four times a day (QID) | ORAL | 0 refills | Status: DC | PRN

## 2023-11-09 MED ORDER — SODIUM CHLORIDE 0.9 % IV BOLUS
1000.0000 mL | Freq: Once | INTRAVENOUS | Status: AC
  Administered 2023-11-09: 1000 mL via INTRAVENOUS

## 2023-11-09 NOTE — ED Notes (Signed)
Pt unable to provide urine sample at this time 

## 2023-11-09 NOTE — Discharge Instructions (Signed)
You can use the nausea medication that I prescribed as needed up to every 6 hours.  Your potassium was very mildly low.  I recommend that you drink some electrolyte containing fluids including Gatorade and Pedialyte.  Please follow-up with your primary care doctor if you continue to have nausea and vomiting.

## 2023-11-09 NOTE — ED Provider Notes (Signed)
[place] EMERGENCY DEPARTMENT AT [facility] Provider Note   CSN: [id number] Arrival date & time: 11/09/23  1945     History  Chief Complaint  Patient presents with   Emesis    Holly Hutchinson is a 42 y.o. female with noncontributory past medical history who presents with concern for nausea, vomiting for 3 days without fever, chills or abdominal pain.  Patient reports that she had poor appetite 1 day prior to the vomiting starting and that she often has the symptoms prior to having an episode of upper abdominal pain nausea and vomiting.  She has been seen previously for similar.  Patient reports that she has not been able to keep any fluids down and is requesting IV fluids.   Emesis      Home Medications Prior to Admission medications   Medication Sig Start Date End Date Taking? Authorizing Provider  ondansetron (ZOFRAN-ODT) 4 MG disintegrating tablet Take 1 tablet (4 mg total) by mouth every 6 (six) hours as needed for nausea or vomiting. 11/09/23  Yes Jarad Barth H, PA-C  albuterol (VENTOLIN HFA) 108 (90 Base) MCG/ACT inhaler Inhale 1-2 puffs into the lungs every 6 (six) hours as needed for wheezing or shortness of breath. Patient not taking: Reported on 07/03/2023 02/06/22   Valentino Nose, NP  cetirizine (ZYRTEC ALLERGY) 10 MG tablet Take 1 tablet (10 mg total) by mouth daily. Patient not taking: Reported on 07/03/2023 02/21/23   Fayrene Helper, PA-C  fluticasone [facility]) 50 MCG/ACT nasal spray Place 1 spray into both nostrils daily. Patient not taking: Reported on 07/03/2023 02/21/23   Fayrene Helper, PA-C  ibuprofen (ADVIL) 800 MG tablet Take 1 tablet (800 mg total) by mouth every 8 (eight) hours as needed. Patient not taking: Reported on 07/03/2023 10/03/22   Brock Bad, MD  LO LOESTRIN FE 1 MG-10 MCG / 10 MCG tablet Take 1 tablet by mouth daily. Start taking pills on the 1st day of period. Patient not taking: Reported on 07/03/2023 10/03/22   Brock Bad, MD  metroNIDAZOLE (FLAGYL) 500 MG tablet Take 1 tablet (500 mg total) by mouth 2 (two) times daily. Patient not taking: Reported on 07/03/2023 10/05/22   Brock Bad, MD  metroNIDAZOLE (FLAGYL) 500 MG tablet Take 1 tablet (500 mg total) by mouth 2 (two) times daily. 11/07/23   Adam Phenix, MD  omeprazole (PRILOSEC) 20 MG capsule Take 1 capsule (20 mg total) by mouth 2 (two) times daily before a meal for 14 days. 07/03/23 07/17/23  Defelice, Para March, NP  Prenat-FeCbn-FeAsp-Meth-FA-DHA (PRENATE MINI) 18-0.6-0.4-350 MG CAPS TAKE 1 CAPSULE BY MOUTH DAILY BEFORE BREAKFAST 10/07/23   Constant, Gigi Gin, MD      Allergies    Erythromycin    Review of Systems   Review of Systems  Gastrointestinal:  Positive for vomiting.  All other systems reviewed and are negative.   Physical Exam Updated Vital Signs BP (!) 172/89   Pulse 60   Temp 98.9 F (37.2 C)   Resp 20   Wt 56.2 kg   LMP 10/25/2023 (Exact Date)   SpO2 100%   BMI 21.97 kg/m  Physical Exam Vitals and nursing note reviewed.  Constitutional:      General: She is not in acute distress.    Appearance: Normal appearance.  HENT:     Head: Normocephalic and atraumatic.     Mouth/Throat:     Mouth: Mucous membranes are dry.  Eyes:     General:  Right eye: No discharge.        Left eye: No discharge.  Cardiovascular:     Rate and Rhythm: Normal rate and regular rhythm.     Heart sounds: No murmur heard.    No friction rub. No gallop.  Pulmonary:     Effort: Pulmonary effort is normal.     Breath sounds: Normal breath sounds.  Abdominal:     General: Bowel sounds are normal.     Palpations: Abdomen is soft.     Comments: Mild tenderness to palpation in the epigastric region  Skin:    General: Skin is warm and dry.     Capillary Refill: Capillary refill takes less than 2 seconds.  Neurological:     Mental Status: She is alert and oriented to person, place, and time.  Psychiatric:        Mood and  Affect: Mood normal.        Behavior: Behavior normal.     ED Results / Procedures / Treatments   Labs (all labs ordered are listed, but only abnormal results are displayed) Labs Reviewed  COMPREHENSIVE METABOLIC PANEL - Abnormal; Notable for the following components:      Result Value   Potassium 3.3 (*)    Glucose, Bld 142 (*)    AST 63 (*)    All other components within normal limits  CBC - Abnormal; Notable for the following components:   WBC 10.7 (*)    MCV 79.0 (*)    All other components within normal limits  LIPASE, BLOOD  URINALYSIS, ROUTINE W REFLEX MICROSCOPIC  PREGNANCY, URINE    EKG None  Radiology No results found.  Procedures Procedures    Medications Ordered in ED Medications  alum & mag hydroxide-simeth (MAALOX/MYLANTA) 200-200-20 MG/5ML suspension 30 mL (30 mLs Oral Patient Refused/Not Given 11/09/23 2156)    And  lidocaine (XYLOCAINE) 2 % viscous mouth solution 15 mL (15 mLs Oral Patient Refused/Not Given 11/09/23 2157)  potassium chloride SA (KLOR-CON M) CR tablet 40 mEq (40 mEq Oral Patient Refused/Not Given 11/09/23 2152)  sodium chloride 0.9 % bolus 1,000 mL (1,000 mLs Intravenous New Bag/Given 11/09/23 2159)  ondansetron (ZOFRAN) injection 4 mg (4 mg Intravenous Given 11/09/23 2153)    ED Course/ Medical Decision Making/ A&P                                 Medical Decision Making Amount and/or Complexity of Data Reviewed Labs: ordered.  Risk OTC drugs. Prescription drug management.   This patient is a 42 y.o. female  who presents to the ED for concern of nausea, vomiting without abdominal pain.   Differential diagnoses prior to evaluation: The emergent differential diagnosis includes, but is not limited to, gastroenteritis, gastritis, GERD, upper respiratory infection, other acute GI issue, less likely given no abdominal pain but considered AAA, mesenteric ischemia, appendicitis, diverticulitis, DKA, AMI, nephrolithiasis,  pancreatitis, peritonitis, adrenal insufficiency,lead poisoning, iron toxicity, intestinal ischemia, constipation, UTI,SBO/LBO, splenic rupture, biliary disease, IBD, IBS, PUD, or hepatitis . This is not an exhaustive differential.   Past Medical History / Co-morbidities / Social History: Overall noncontributory  Additional history: Chart reviewed. Pertinent results include: Reviewed lab work, imaging from previous ED visits  Physical Exam: Physical exam performed. The pertinent findings include: Somewhat hypertensive in the ED, blood pressure 172/89, mild tenderness palpation the epigastric region, no rebound, rigidity, guarding, normal bowel sounds throughout.  She has  mildly dry mucous membranes.  Otherwise well-appearing.  Lab Tests/Imaging studies: I personally interpreted labs/imaging and the pertinent results include: CBC with mild leukocytosis, white blood cells 10.7, CMP notable for mild hyponatremia, potassium 3.3, as well as mildly elevated AST at 63.  No other abnormalities of liver enzymes.  Lipase normal..    Medications: I ordered medication including fluids, nausea medication, potassium, patient refused GI cocktail.  She is tolerating p.o., in fact she requested oral fluids, and has not had any active vomiting in the emergency department.  I have reviewed the patients home medicines and have made adjustments as needed.   Disposition: After consideration of the diagnostic results and the patients response to treatment, I feel that patient with nausea and vomiting, mild hypokalemia, she is stable for discharge, discharged with Zofran and encouraged electrolyte containing fluids for rehydration.   emergency department workup does not suggest an emergent condition requiring admission or immediate intervention beyond what has been performed at this time. The plan is: as above. The patient is safe for discharge and has been instructed to return immediately for worsening symptoms,  change in symptoms or any other concerns.  Final Clinical Impression(s) / ED Diagnoses Final diagnoses:  Nausea and vomiting, unspecified vomiting type  Hypokalemia    Rx / DC Orders ED Discharge Orders          Ordered    ondansetron (ZOFRAN-ODT) 4 MG disintegrating tablet  Every 6 hours PRN        11/09/23 2257              Olene Floss, PA-C 11/09/23 2257    Lonell Grandchild, MD 11/10/23 1459

## 2023-11-09 NOTE — ED Triage Notes (Signed)
Pt to triage c/o N/V x 3 days. Pt denies fever chills abd pain. Pt VSS NAD Pt on room air.

## 2023-11-19 ENCOUNTER — Ambulatory Visit
Admission: RE | Admit: 2023-11-19 | Discharge: 2023-11-19 | Disposition: A | Discharge status: Home / Self Care | Payer: Medicaid Other | Source: Ambulatory Visit | Attending: Obstetrics | Admitting: Obstetrics

## 2023-11-19 DIAGNOSIS — R921 Mammographic calcification found on diagnostic imaging of breast: Secondary | ICD-10-CM

## 2024-01-15 DIAGNOSIS — H5213 Myopia, bilateral: Secondary | ICD-10-CM | POA: Diagnosis not present

## 2024-02-03 ENCOUNTER — Other Ambulatory Visit (HOSPITAL_COMMUNITY)
Admission: RE | Admit: 2024-02-03 | Discharge: 2024-02-03 | Disposition: A | Discharge status: Home / Self Care | Payer: Medicaid Other | Source: Ambulatory Visit | Attending: Advanced Practice Midwife | Admitting: Advanced Practice Midwife

## 2024-02-03 ENCOUNTER — Encounter: Payer: Self-pay | Admitting: Advanced Practice Midwife

## 2024-02-03 ENCOUNTER — Ambulatory Visit: Payer: Medicaid Other | Admitting: Advanced Practice Midwife

## 2024-02-03 VITALS — BP 109/68 | HR 76 | Ht 63.0 in | Wt 114.8 lb

## 2024-02-03 DIAGNOSIS — N3946 Mixed incontinence: Secondary | ICD-10-CM

## 2024-02-03 DIAGNOSIS — Z01419 Encounter for gynecological examination (general) (routine) without abnormal findings: Secondary | ICD-10-CM

## 2024-02-03 DIAGNOSIS — D1722 Benign lipomatous neoplasm of skin and subcutaneous tissue of left arm: Secondary | ICD-10-CM

## 2024-02-03 DIAGNOSIS — Z113 Encounter for screening for infections with a predominantly sexual mode of transmission: Secondary | ICD-10-CM

## 2024-02-03 DIAGNOSIS — Z124 Encounter for screening for malignant neoplasm of cervix: Secondary | ICD-10-CM | POA: Insufficient documentation

## 2024-02-03 DIAGNOSIS — A5901 Trichomonal vulvovaginitis: Secondary | ICD-10-CM

## 2024-02-03 DIAGNOSIS — R634 Abnormal weight loss: Secondary | ICD-10-CM

## 2024-02-03 NOTE — Progress Notes (Signed)
 Pt presents for AEX.  Requesting PAP and STD testing.  Mammogram 11/2023

## 2024-02-03 NOTE — Progress Notes (Signed)
 Subjective:     Holly Hutchinson is a 43 y.o. female here at [facility] for a routine exam.  Current complaints: weight loss recently, pt wants appetite stimulant to help; large bump on left arm that has been there for years, but is larger recently; urinary incontinence with cough/laugh/sneeze and full bladder.  Personal and family health history reviewed: yes.  Do you have a primary care provider? no Do you feel safe at home? yes  Flowsheet Row [facility] Visit [facility]/03/[facility] Total Score 0       Health Maintenance Due  Topic Date Due   Pneumococcal Vaccine 71-71 Years old (1 of 2 - PCV) Never done   DTaP/Tdap/Td (1 - Tdap) Never done   INFLUENZA VACCINE  Never done   COVID-19 Vaccine (1 - 2024-25 season) Never done     Risk factors for chronic health problems: Smoking: former cigarette smoker Alchohol/how much: Pt BMI: Body mass index is 20.34 kg/m.   Gynecologic History Patient's last menstrual period was 02/02/2024. Contraception: condoms Last Pap: 10/03/22. Results were: normal. 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 Results were: normal  Obstetric History OB History  Gravida Para Term Preterm AB Living  6    2 4   SAB IAB Ectopic Multiple Live Births   1 1      # Outcome Date GA Lbr Len/2nd Weight Sex Type Anes PTL Lv  6 Gravida           5 Gravida           4 Gravida           3 Gravida           2 IAB           1 Ectopic              The following portions of the patient's history were reviewed and updated as appropriate: allergies, current medications, past family history, past medical history, past social history, past surgical history, and problem list.  Review of Systems Pertinent items noted in HPI and remainder of comprehensive ROS otherwise negative.    Objective:  BP 109/68   Pulse 76   Ht 5\' 3"  (1.6 m)   Wt 114 lb 12.8 oz (52.1 kg)   LMP 02/02/2024    BMI 20.34 kg/m   VS reviewed, nursing note reviewed,  Constitutional: well developed, well nourished, no distress HEENT: normocephalic, thyroid without enlargement or mass HEART: RRR, no murmurs rubs/gallops RESP: clear and equal to auscultation bilaterally in all lobes  Breast Exam: exam performed: right breast normal without mass, skin or nipple changes or axillary nodes, left breast normal without mass, skin or nipple changes or axillary nodes Abdomen: soft Neuro: alert and oriented x 3 Skin: warm, dry Psych: affect normal Pelvic exam:  Bimanual exam: Cervix 0/long/high, firm, anterior, neg CMT, uterus nontender, nonenlarged, adnexa without tenderness, enlargement, or mass        Assessment/Plan:   1. Encounter for annual routine gynecological examination   2. Screening for cervical cancer (Primary)  - Cytology - PAP( [place])  3. Screening examination for STI  - Cervicovaginal ancillary only( [place]) - HIV antibody (with reflex) - RPR - Hepatitis C Antibody - Hepatitis B Surface AntiGEN  4. Weight loss, non-intentional  - TSH - T3, free - T4, free  5. Mixed stress and urge incontinence  -  Ambulatory referral to Physical Therapy - Ambulatory referral to Urogynecology  6. Lipoma of left upper extremity  - Ambulatory referral to Dermatology     Return for Schedule follow up visit as soon as available with Dr Clearance Coots.   Sharen Counter, CNM 3:05 PM

## 2024-02-04 LAB — TSH: TSH: 0.421 u[IU]/mL — ABNORMAL LOW (ref 0.450–4.500)

## 2024-02-04 LAB — CERVICOVAGINAL ANCILLARY ONLY
Bacterial Vaginitis (gardnerella): NEGATIVE
Candida Glabrata: NEGATIVE
Candida Vaginitis: NEGATIVE
Chlamydia: NEGATIVE
Comment: NEGATIVE
Comment: NEGATIVE
Comment: NEGATIVE
Comment: NEGATIVE
Comment: NEGATIVE
Comment: NORMAL
Neisseria Gonorrhea: NEGATIVE
Trichomonas: POSITIVE — AB

## 2024-02-04 LAB — HIV ANTIBODY (ROUTINE TESTING W REFLEX): HIV Screen 4th Generation wRfx: NONREACTIVE

## 2024-02-04 LAB — HEPATITIS B SURFACE ANTIGEN: Hepatitis B Surface Ag: NEGATIVE

## 2024-02-04 LAB — RPR: RPR Ser Ql: NONREACTIVE

## 2024-02-04 LAB — HEPATITIS C ANTIBODY: Hep C Virus Ab: NONREACTIVE

## 2024-02-04 LAB — T4, FREE: Free T4: 0.84 ng/dL (ref 0.82–1.77)

## 2024-02-04 LAB — T3, FREE: T3, Free: 2.9 pg/mL (ref 2.0–4.4)

## 2024-02-05 ENCOUNTER — Ambulatory Visit: Payer: Medicaid Other | Admitting: Internal Medicine

## 2024-02-05 LAB — CYTOLOGY - PAP
Comment: NEGATIVE
Comment: NEGATIVE
Comment: NEGATIVE
Diagnosis: NEGATIVE
HPV 16: NEGATIVE
HPV 18 / 45: NEGATIVE
High risk HPV: POSITIVE — AB

## 2024-02-06 ENCOUNTER — Encounter: Payer: Self-pay | Admitting: Advanced Practice Midwife

## 2024-02-06 MED ORDER — METRONIDAZOLE 500 MG PO TABS: 500.0000 mg | ORAL_TABLET | Freq: Two times a day (BID) | ORAL | 0 refills | Status: AC

## 2024-02-06 NOTE — Addendum Note (Signed)
 Addended by: Sharen Counter A on: 02/06/2024 04:51 PM   Modules accepted: Orders

## 2024-02-27 ENCOUNTER — Other Ambulatory Visit: Payer: Self-pay | Admitting: Obstetrics

## 2024-02-27 DIAGNOSIS — M791 Myalgia, unspecified site: Secondary | ICD-10-CM

## 2024-03-03 ENCOUNTER — Telehealth: Payer: Self-pay | Admitting: *Deleted

## 2024-03-03 NOTE — Telephone Encounter (Signed)
 RTC to pt regarding refill Ibuprofen. Confirmed that RX with refills was sent to the pharmacy. Pt now states she just picked it up. She reports she misunderstood which of her prescription refill requests had not gone thru.

## 2024-03-10 NOTE — Progress Notes (Unsigned)
 Name: Holly Hutchinson  MRN/ DOB: [id number], [birth year]    Age/ Sex: 43 y.o., female    PCP: Brock Bad, MD   Reason for Endocrinology Evaluation: Low TSH     Date of Initial Endocrinology Evaluation: 03/16/2022    HPI: Holly Hutchinson is a 43 y.o. female with unremarkable past medical history. The patient presented for initial endocrinology clinic visit on 03/16/2022  for consultative assistance with her low TSH.   The patient has a history of low TSH dating back to 2010.  She has hx of local neck swelling , she had a left prominent nodule in ~2020 but this has decreased to sub-centimeter measurement  TRAb undetectable Thyroid ultrasound on 03/21/2022 revealed a solitary left subcentimeter thyroid nodule  Maternal and paternal aunts with thyroid disease     SUBJECTIVE:    Today (03/11/24):  Holly Hutchinson is here for a follow up on subclinical hyperthyroidism.  She had recent labs 01/2024 that show low TSH that she attributes to stress   Patient has been noted with weight loss She stopped her natural thyroid supplements  She has noted brain fogs  Has noted slight increase in thyroid nodule She had loose stools yesterday  No palpitations  No hand tremors     HISTORY:  Past Medical History:  Past Medical History:  Diagnosis Date   Bronchitis    Past Surgical History:  Past Surgical History:  Procedure Laterality Date   BREAST BIOPSY Left 04/2022   ECTOPIC PREGNANCY SURGERY      Social History:  reports that she quit smoking about 3 years ago. Her smoking use included cigarettes. She has quit using smokeless tobacco. She reports current alcohol use. She reports that she does not use drugs. Family History: family history includes Breast cancer in her maternal grandmother.   HOME MEDICATIONS: Allergies as of 03/11/2024       Reactions   Erythromycin Hives, Swelling        Medication List        Accurate as of March 11, 2024  8:36 AM. If you  have any questions, ask your nurse or doctor.          albuterol 108 (90 Base) MCG/ACT inhaler Commonly known as: VENTOLIN HFA Inhale 1-2 puffs into the lungs every 6 (six) hours as needed for wheezing or shortness of breath.   cetirizine 10 MG tablet Commonly known as: ZyrTEC Allergy Take 1 tablet (10 mg total) by mouth daily.   fluticasone 50 MCG/ACT nasal spray Commonly known as: FLONASE Place 1 spray into both nostrils daily.   ibuprofen 800 MG tablet Commonly known as: ADVIL TAKE 1 TABLET(800 MG) BY MOUTH EVERY 8 HOURS AS NEEDED   Lo Loestrin Fe 1 MG-10 MCG / 10 MCG tablet Generic drug: Norethindrone-Ethinyl Estradiol-Fe Biphas Take 1 tablet by mouth daily. Start taking pills on the 1st day of period.   omeprazole 20 MG capsule Commonly known as: PRILOSEC Take 1 capsule (20 mg total) by mouth 2 (two) times daily before a meal for 14 days.   ondansetron 4 MG disintegrating tablet Commonly known as: ZOFRAN-ODT Take 1 tablet (4 mg total) by mouth every 6 (six) hours as needed for nausea or vomiting.   Prenate Mini 18-0.6-0.4-350 MG Caps TAKE 1 CAPSULE BY MOUTH DAILY BEFORE BREAKFAST          REVIEW OF SYSTEMS: A comprehensive ROS was conducted with the patient and is negative except as per HPI  OBJECTIVE:  VS: BP 114/76 (BP Location: Left Arm, Patient Position: Sitting, Cuff Size: Small)   Pulse 62   Ht 5\' 3"  (1.6 m)   Wt 113 lb (51.3 kg)   SpO2 98%   BMI 20.02 kg/m    Wt Readings from Last 3 Encounters:  03/11/24 113 lb (51.3 kg)  02/03/24 114 lb 12.8 oz (52.1 kg)  11/09/23 124 lb (56.2 kg)     EXAM: General: Pt appears well and is in NAD  Neck:  Thyroid: Thyroid is prominent   Lungs: Clear with good BS bilat   Heart: Auscultation: RRR.  Abdomen: soft, nontender  Extremities:  BL LE: No pretibial edema   Mental Status: Judgment, insight: Intact Orientation: Oriented to time, place, and person Mood and affect: No depression, anxiety, or  agitation     DATA REVIEWED:   Latest Reference Range & Units 02/03/24 15:08  TSH 0.450 - 4.500 uIU/mL 0.421 (L)  Triiodothyronine,Free,Serum 2.0 - 4.4 pg/mL 2.9  T4,Free(Direct) 0.82 - 1.77 ng/dL 4.74  (L): Data is abnormally low   Thyroid ultrasound 03/21/2022   Estimated total number of nodules >/= 1 cm: 0   Number of spongiform nodules >/=  2 cm not described below (TR1): 0   Number of mixed cystic and solid nodules >/= 1.5 cm not described below (TR2): 0   _________________________________________________________   Nodule # 1:   Location: Left; Mid   Maximum size: 0.6 cm; Other 2 dimensions: 0.6 x 0.6 cm   Composition: solid/almost completely solid (2)   Echogenicity: hypoechoic (2)   Shape: not taller-than-wide (0)   Margins: smooth (0)   Echogenic foci: none (0)   ACR TI-RADS total points: 4.   ACR TI-RADS risk category: TR4 (4-6 points).   ACR TI-RADS recommendations:   Given size (<0.9 cm) and appearance, this nodule does NOT meet TI-RADS criteria for biopsy or dedicated follow-up.   _________________________________________________________   No cervical lymphadenopathy.   IMPRESSION: 1. Moderately enlarged thyroid gland with normal sonographic appearance of the thyroid parenchyma. 2. Solitary nodule in the left mid thyroid (0.6 cm) which appears benign and does not meet criteria (TI-RADS category 4) for additional ultrasound follow-up or tissue sampling.   ASSESSMENT/PLAN/RECOMMENDATIONS:   Subclinical Hyperthyroidism  -Patient is clinically euthyroid -No local neck symptoms -Suspect autonomous nodule  - She is NOT keen on any medications if not necessary, supplements to help with her thyroid, she has been off of them, patient will return to taking the supplements . -Thyroid ultrasound showed sub-centimeter left thyroid nodule that did not require any follow up   Follow-up 4 months  Signed electronically by: Lyndle Herrlich,  MD  [facility]  [facility] [address] [place], [place] [address] Phone: [phone] FAX: [phone]   CC: Brock Bad, MD [address] [place] [address] Phone: [phone] Fax: [phone]   Return to Endocrinology clinic as below: Future Appointments  Date Time Provider Department Center  03/19/2024 11:45 AM Earna Coder C, PT [facility] None  05/25/2024 10:00 AM Selmer Dominion, NP [facility]

## 2024-03-11 ENCOUNTER — Ambulatory Visit (INDEPENDENT_AMBULATORY_CARE_PROVIDER_SITE_OTHER): Payer: Medicaid Other | Admitting: Internal Medicine

## 2024-03-11 ENCOUNTER — Encounter: Payer: Self-pay | Admitting: Internal Medicine

## 2024-03-11 VITALS — BP 114/76 | HR 62 | Ht 63.0 in | Wt 113.0 lb

## 2024-03-11 DIAGNOSIS — E0590 Thyrotoxicosis, unspecified without thyrotoxic crisis or storm: Secondary | ICD-10-CM

## 2024-03-14 DIAGNOSIS — H5213 Myopia, bilateral: Secondary | ICD-10-CM | POA: Diagnosis not present

## 2024-03-19 ENCOUNTER — Ambulatory Visit: Payer: Medicaid Other | Attending: Advanced Practice Midwife | Admitting: Physical Therapy

## 2024-04-03 ENCOUNTER — Emergency Department (HOSPITAL_BASED_OUTPATIENT_CLINIC_OR_DEPARTMENT_OTHER)
Admission: EM | Admit: 2024-04-03 | Discharge: 2024-04-03 | Disposition: A | Discharge status: Home / Self Care | Attending: Emergency Medicine | Admitting: Emergency Medicine

## 2024-04-03 ENCOUNTER — Encounter (HOSPITAL_BASED_OUTPATIENT_CLINIC_OR_DEPARTMENT_OTHER): Payer: Self-pay | Admitting: Emergency Medicine

## 2024-04-03 ENCOUNTER — Other Ambulatory Visit: Payer: Self-pay

## 2024-04-03 DIAGNOSIS — E876 Hypokalemia: Secondary | ICD-10-CM | POA: Diagnosis not present

## 2024-04-03 DIAGNOSIS — R112 Nausea with vomiting, unspecified: Secondary | ICD-10-CM | POA: Diagnosis not present

## 2024-04-03 DIAGNOSIS — R9431 Abnormal electrocardiogram [ECG] [EKG]: Secondary | ICD-10-CM | POA: Diagnosis not present

## 2024-04-03 LAB — CBC WITH DIFFERENTIAL/PLATELET
Abs Immature Granulocytes: 0.02 10*3/uL (ref 0.00–0.07)
Basophils Absolute: 0 10*3/uL (ref 0.0–0.1)
Basophils Relative: 0 %
Eosinophils Absolute: 0 10*3/uL (ref 0.0–0.5)
Eosinophils Relative: 0 %
HCT: 38.1 % (ref 36.0–46.0)
Hemoglobin: 12.5 g/dL (ref 12.0–15.0)
Immature Granulocytes: 0 %
Lymphocytes Relative: 8 %
Lymphs Abs: 0.7 10*3/uL (ref 0.7–4.0)
MCH: 26.8 pg (ref 26.0–34.0)
MCHC: 32.8 g/dL (ref 30.0–36.0)
MCV: 81.6 fL (ref 80.0–100.0)
Monocytes Absolute: 0.4 10*3/uL (ref 0.1–1.0)
Monocytes Relative: 4 %
Neutro Abs: 8.2 10*3/uL — ABNORMAL HIGH (ref 1.7–7.7)
Neutrophils Relative %: 88 %
Platelets: 286 10*3/uL (ref 150–400)
RBC: 4.67 MIL/uL (ref 3.87–5.11)
RDW: 14.4 % (ref 11.5–15.5)
WBC: 9.4 10*3/uL (ref 4.0–10.5)
nRBC: 0 % (ref 0.0–0.2)

## 2024-04-03 LAB — COMPREHENSIVE METABOLIC PANEL WITH GFR
ALT: 41 U/L (ref 0–44)
AST: 61 U/L — ABNORMAL HIGH (ref 15–41)
Albumin: 4.9 g/dL (ref 3.5–5.0)
Alkaline Phosphatase: 64 U/L (ref 38–126)
Anion gap: 13 (ref 5–15)
BUN: 12 mg/dL (ref 6–20)
CO2: 23 mmol/L (ref 22–32)
Calcium: 10.1 mg/dL (ref 8.9–10.3)
Chloride: 102 mmol/L (ref 98–111)
Creatinine, Ser: 0.72 mg/dL (ref 0.44–1.00)
GFR, Estimated: >60 mL/min (ref >60)
Glucose, Bld: 155 mg/dL — ABNORMAL HIGH (ref 70–99)
Potassium: 3.4 mmol/L — ABNORMAL LOW (ref 3.5–5.1)
Sodium: 138 mmol/L (ref 135–145)
Total Bilirubin: 0.5 mg/dL (ref 0.0–1.2)
Total Protein: 7.6 g/dL (ref 6.5–8.1)

## 2024-04-03 LAB — URINALYSIS, ROUTINE W REFLEX MICROSCOPIC
Bacteria, UA: NONE SEEN
Bilirubin Urine: NEGATIVE
Glucose, UA: NEGATIVE mg/dL
Ketones, ur: 40 mg/dL — AB
Leukocytes,Ua: NEGATIVE
Nitrite: NEGATIVE
Protein, ur: 30 mg/dL — AB
Specific Gravity, Urine: 1.016 (ref 1.005–1.030)
pH: 8 (ref 5.0–8.0)

## 2024-04-03 LAB — MAGNESIUM: Magnesium: 1.5 mg/dL — ABNORMAL LOW (ref 1.7–2.4)

## 2024-04-03 LAB — HCG, QUANTITATIVE, PREGNANCY: hCG, Beta Chain, Quant, S: <1 m[IU]/mL (ref <5)

## 2024-04-03 LAB — PREGNANCY, URINE: Preg Test, Ur: NEGATIVE

## 2024-04-03 LAB — LIPASE, BLOOD: Lipase: 48 U/L (ref 11–51)

## 2024-04-03 MED ORDER — POTASSIUM CHLORIDE CRYS ER 20 MEQ PO TBCR
40.0000 meq | EXTENDED_RELEASE_TABLET | Freq: Once | ORAL | Status: AC
  Administered 2024-04-03: 40 meq via ORAL
  Filled 2024-04-03: qty 2

## 2024-04-03 MED ORDER — MAGNESIUM SULFATE 2 GM/50ML IV SOLN
2.0000 g | Freq: Once | INTRAVENOUS | Status: AC
Start: 2024-04-03 — End: 2024-04-03
  Administered 2024-04-03: 2 g via INTRAVENOUS
  Filled 2024-04-03: qty 50

## 2024-04-03 MED ORDER — POTASSIUM CHLORIDE 10 MEQ/100ML IV SOLN
10.0000 meq | Freq: Once | INTRAVENOUS | Status: AC
Start: 2024-04-03 — End: 2024-04-03
  Administered 2024-04-03: 10 meq via INTRAVENOUS
  Filled 2024-04-03: qty 100

## 2024-04-03 MED ORDER — PROCHLORPERAZINE MALEATE 10 MG PO TABS: 10.0000 mg | ORAL_TABLET | Freq: Two times a day (BID) | ORAL | 0 refills | Status: DC | PRN

## 2024-04-03 MED ORDER — PROCHLORPERAZINE EDISYLATE 10 MG/2ML IJ SOLN
10.0000 mg | INTRAMUSCULAR | Status: AC
  Administered 2024-04-03: 10 mg via INTRAVENOUS
  Filled 2024-04-03: qty 2

## 2024-04-03 MED ORDER — LACTATED RINGERS IV BOLUS
2000.0000 mL | Freq: Once | INTRAVENOUS | Status: AC
Start: 2024-04-03 — End: 2024-04-03
  Administered 2024-04-03: 2000 mL via INTRAVENOUS

## 2024-04-03 MED ORDER — DIPHENHYDRAMINE HCL 50 MG/ML IJ SOLN
12.5000 mg | Freq: Once | INTRAMUSCULAR | Status: AC
  Administered 2024-04-03: 12.5 mg via INTRAVENOUS
  Filled 2024-04-03: qty 1

## 2024-04-03 NOTE — ED Provider Notes (Signed)
 [facility] AT [facility] Provider Note   CSN: [id number] Arrival date & time: 04/03/24  4098     History  Chief Complaint  Patient presents with   Emesis    Holly Hutchinson is a 43 y.o. female.  43 year old female with a history of frequent nausea and vomiting who presents to the emergency department with the same.  Reports that 2 days ago she started experiencing nausea and vomiting.  Has had multiple episodes per day but does not recall the exact number.  No diarrhea.  Says that she has had a gas-like sensation in her abdomen but is not currently having pain.  No history of abdominal surgeries.  Occasionally smokes marijuana but not daily.       Home Medications Prior to Admission medications   Medication Sig Start Date End Date Taking? Authorizing Provider  prochlorperazine  (COMPAZINE ) 10 MG tablet Take 1 tablet (10 mg total) by mouth 2 (two) times daily as needed for nausea or vomiting. 04/03/24  Yes Ninetta Basket, MD  albuterol  (VENTOLIN  HFA) 108 (90 Base) MCG/ACT inhaler Inhale 1-2 puffs into the lungs every 6 (six) hours as needed for wheezing or shortness of breath. Patient not taking: Reported on 07/03/2023 02/06/22   Wilhemena Harbour, NP  cetirizine  (ZYRTEC  ALLERGY) 10 MG tablet Take 1 tablet (10 mg total) by mouth daily. Patient not taking: Reported on 07/03/2023 02/21/23   Debbra Fairy, PA-C  fluticasone  (FLONASE ) 50 MCG/ACT nasal spray Place 1 spray into both nostrils daily. Patient not taking: Reported on 07/03/2023 02/21/23   Debbra Fairy, PA-C  ibuprofen  (ADVIL ) 800 MG tablet TAKE 1 TABLET(800 MG) BY MOUTH EVERY 8 HOURS AS NEEDED Patient not taking: Reported on 03/11/2024 03/01/24   Granville Layer, MD  LO LOESTRIN FE  1 MG-10 MCG / 10 MCG tablet Take 1 tablet by mouth daily. Start taking pills on the 1st day of period. Patient not taking: Reported on 07/03/2023 10/03/22   Gabrielle Joiner, MD  omeprazole  (PRILOSEC) 20 MG capsule Take 1  capsule (20 mg total) by mouth 2 (two) times daily before a meal for 14 days. 07/03/23 07/17/23  Defelice, Jeanette, NP  ondansetron  (ZOFRAN -ODT) 4 MG disintegrating tablet Take 1 tablet (4 mg total) by mouth every 6 (six) hours as needed for nausea or vomiting. Patient not taking: Reported on 03/11/2024 11/09/23   Prosperi, Christian H, PA-C  Prenat-FeCbn-FeAsp-Meth-FA-DHA (PRENATE MINI ) 18-0.6-0.4-350 MG CAPS TAKE 1 CAPSULE BY MOUTH DAILY BEFORE BREAKFAST Patient not taking: Reported on 03/11/2024 10/07/23   Constant, Peggy, MD      Allergies    Erythromycin    Review of Systems   Review of Systems  Physical Exam Updated Vital Signs BP (!) 134/91   Pulse 70   Temp 98.1 F (36.7 C) (Temporal)   Resp (!) 26   SpO2 100%  Physical Exam Vitals and nursing note reviewed.  Constitutional:      General: She is not in acute distress.    Appearance: She is well-developed.  HENT:     Head: Normocephalic and atraumatic.     Right Ear: External ear normal.     Left Ear: External ear normal.     Nose: Nose normal.  Eyes:     Extraocular Movements: Extraocular movements intact.     Conjunctiva/sclera: Conjunctivae normal.     Pupils: Pupils are equal, round, and reactive to light.  Pulmonary:     Effort: Pulmonary effort is normal. No respiratory distress.  Abdominal:     General: Abdomen is flat. There is no distension.     Palpations: Abdomen is soft. There is no mass.     Tenderness: There is no abdominal tenderness. There is no guarding.  Musculoskeletal:     Cervical back: Normal range of motion and neck supple.  Skin:    General: Skin is warm and dry.  Neurological:     Mental Status: She is alert and oriented to person, place, and time. Mental status is at baseline.  Psychiatric:        Mood and Affect: Mood normal.     ED Results / Procedures / Treatments   Labs (all labs ordered are listed, but only abnormal results are displayed) Labs Reviewed  COMPREHENSIVE METABOLIC  PANEL WITH GFR - Abnormal; Notable for the following components:      Result Value   Potassium 3.4 (*)    Glucose, Bld 155 (*)    AST 61 (*)    All other components within normal limits  CBC WITH DIFFERENTIAL/PLATELET - Abnormal; Notable for the following components:   Neutro Abs 8.2 (*)    All other components within normal limits  URINALYSIS, ROUTINE W REFLEX MICROSCOPIC - Abnormal; Notable for the following components:   Hgb urine dipstick SMALL (*)    Ketones, ur 40 (*)    Protein, ur 30 (*)    All other components within normal limits  MAGNESIUM  - Abnormal; Notable for the following components:   Magnesium  1.5 (*)    All other components within normal limits  LIPASE, BLOOD  PREGNANCY, URINE  HCG, QUANTITATIVE, PREGNANCY    EKG EKG Interpretation Date/Time:  Friday April 03 2024 11:59:36 EDT Ventricular Rate:  82 PR Interval:  141 QRS Duration:  89 QT Interval:  408 QTC Calculation: 477 R Axis:   74  Text Interpretation: Sinus rhythm Probable left atrial enlargement Confirmed by Shyrl Doyne [phone]) on 04/03/2024 12:00:27 PM  Radiology No results found.  Procedures Procedures    Medications Ordered in ED Medications  lactated ringers  bolus 2,000 mL (0 mLs Intravenous Stopped 04/03/24 1139)  prochlorperazine  (COMPAZINE ) injection 10 mg (10 mg Intravenous Given 04/03/24 0845)  diphenhydrAMINE  (BENADRYL ) injection 12.5 mg (12.5 mg Intravenous Given 04/03/24 0846)  magnesium  sulfate IVPB 2 g 50 mL (2 g Intravenous New Bag/Given 04/03/24 1115)  potassium chloride  10 mEq in 100 mL IVPB (0 mEq Intravenous Stopped 04/03/24 1115)  potassium chloride  SA (KLOR-CON  M) CR tablet 40 mEq (40 mEq Oral Given 04/03/24 1010)    ED Course/ Medical Decision Making/ A&P Clinical Course as of 04/03/24 1221  Fri Apr 03, 2024  1157 Tolerating ginger ale [RP]    Clinical Course User Index [RP] Ninetta Basket, MD                                 Medical Decision Making Amount  and/or Complexity of Data Reviewed Labs: ordered.  Risk Prescription drug management.   Holly Hutchinson is a 43 y.o. female with comorbidities that complicate the patient evaluation including frequent nausea and vomiting and marijuana use who presents to the emergency department with similar symptoms  Initial Ddx:  Gastroenteritis, SBO, appendicitis, electrolyte abnormality, AKI, dehydration, cannabinoid hyperemesis  MDM/Course:  Patient presents the emergency department with nausea and vomiting.  Does have a history of similar.  Does smoke marijuana occasionally but not on a daily basis.  No abdominal surgeries.  No abdominal tenderness to palpation was not having pain when she came in though she was having some bloating sensation before.  EKG did show prolonged QT.  Patient was given IV fluids and Compazine  to limit QT prolongation.  She was found to have low magnesium  and potassium which were replenished.  Repeat EKG showed that her QT interval had normalized.  Upon re-evaluation was able to tolerate p.o.  Will discharge her home with antibiotic emetics.  Will have her follow-up with her PCP.  Return precaution discussed prior to discharge.  This patient presents to the ED for concern of complaints listed in HPI, this involves an extensive number of treatment options, and is a complaint that carries with it a high risk of complications and morbidity. Disposition including potential need for admission considered.   Dispo: DC Home. Return precautions discussed including, but not limited to, those listed in the AVS. Allowed pt time to ask questions which were answered fully prior to dc.  Records reviewed Outpatient Clinic Notes The following labs were independently interpreted: Chemistry and show  hypokalemia I personally reviewed and interpreted cardiac monitoring: normal sinus rhythm  I personally reviewed and interpreted the pt's EKG: see above for interpretation  I have reviewed the  patients home medications and made adjustments as needed  Portions of this note were generated with Dragon dictation software. Dictation errors may occur despite best attempts at proofreading.     Final Clinical Impression(s) / ED Diagnoses Final diagnoses:  Nausea and vomiting, unspecified vomiting type  Hypokalemia  Hypomagnesemia    Rx / DC Orders ED Discharge Orders          Ordered    prochlorperazine  (COMPAZINE ) 10 MG tablet  2 times daily PRN        04/03/24 1201              Ninetta Basket, MD 04/03/24 1221

## 2024-04-03 NOTE — Discharge Instructions (Addendum)
 You were seen for your nausea and vomiting in the emergency department.   At home, please take the Compazine  we have prescribed you.  You may take it with Benadryl  if it makes you anxious or gives you muscle spasms.    Check your MyChart online for the results of any tests that had not resulted by the time you left the emergency department.   Follow-up with your primary doctor in 2-3 days regarding your visit.    Return immediately to the emergency department if you experience any of the following: Worsening nausea or vomiting, or any other concerning symptoms.    Thank you for visiting our Emergency Department. It was a pleasure taking care of you today.

## 2024-04-03 NOTE — ED Triage Notes (Signed)
 C/o n/v since Wednesday. Denies fevers. States LLQ pain.

## 2024-04-03 NOTE — ED Notes (Signed)
 Pt attempted to use bedpan for UA but could not void, wants to try again later.

## 2024-05-25 ENCOUNTER — Encounter: Payer: Self-pay | Admitting: Obstetrics and Gynecology

## 2024-05-25 ENCOUNTER — Ambulatory Visit (INDEPENDENT_AMBULATORY_CARE_PROVIDER_SITE_OTHER): Payer: Medicaid Other | Admitting: Obstetrics and Gynecology

## 2024-05-25 VITALS — BP 108/64 | HR 75 | Ht 62.8 in | Wt 124.4 lb

## 2024-05-25 DIAGNOSIS — R350 Frequency of micturition: Secondary | ICD-10-CM | POA: Diagnosis not present

## 2024-05-25 DIAGNOSIS — N898 Other specified noninflammatory disorders of vagina: Secondary | ICD-10-CM | POA: Diagnosis not present

## 2024-05-25 DIAGNOSIS — N3281 Overactive bladder: Secondary | ICD-10-CM | POA: Diagnosis not present

## 2024-05-25 LAB — POCT URINALYSIS DIP (CLINITEK)
Bilirubin, UA: NEGATIVE
Glucose, UA: NEGATIVE mg/dL
Ketones, POC UA: NEGATIVE mg/dL
Leukocytes, UA: NEGATIVE
Nitrite, UA: NEGATIVE
POC PROTEIN,UA: NEGATIVE
Spec Grav, UA: 1.015 (ref 1.010–1.025)
Urobilinogen, UA: 0.2 U/dL
pH, UA: 6 (ref 5.0–8.0)

## 2024-05-25 NOTE — Patient Instructions (Addendum)
 Today we talked about ways to manage bladder urgency such as altering your diet to avoid irritative beverages and foods (bladder diet) as well as attempting to decrease stress and other exacerbating factors.      The Most Bothersome Foods* The Least Bothersome Foods*  Coffee - Regular & Decaf Tea - caffeinated Carbonated beverages - cola, non-colas, diet & caffeine-free Alcohols - Beer, Red Wine, White Wine, [address] - Grapefruit, Banqu[place], Orange, [employer] - Cranberry, Grapefruit, Orange, Pineapple Vegetables - Tomato & Tomato Products Flavor Enhancers - Hot peppers, Spicy foods, Chili, Horseradish, Vinegar, Monosodium glutamate (MSG) Artificial Sweeteners - NutraSweet, Sweet 'N Low, Equal (sweetener), Saccharin Ethnic foods - [place], [place], [place] food [employer] - low-fat & whole Fruits - Bananas, Blueberries, Honeydew melon, Pears, Raisins, Watermelon Vegetables - Broccoli, [address] Sprouts, [place], Carrots, Cauliflower, [place], Cucumber, Mushrooms, Peas, Radishes, Squash, Zucchini, White potatoes, Sweet potatoes & yams Poultry - Chicken, Eggs, [place], [employer] - Beef, Diplomatic Services operational officer, Lamb Seafood - Shrimp, [place] fish, Salmon Grains - Oat, Rice Snacks - Pretzels, Popcorn  *Theodosia Fishman et al. Diet and its role in interstitial cystitis/bladder pain syndrome (IC/BPS) and comorbid conditions. [employer]. BJU Int. 2012 Jan 11.     You can start pumpkin seed extract up to 5gm daily   For the dryness in the vagina you can use aloe vera or vitamin E cream   Stop drinking closer to 7:30/8pm if you are sleeping around 10-11p

## 2024-05-25 NOTE — Progress Notes (Signed)
 [facility] New Patient Evaluation and Consultation  Referring Provider: Asher Lawn PCP: Gabrielle Joiner, MD Date of Service: 05/25/2024  SUBJECTIVE Chief Complaint: New Patient (Initial Visit) Holly Hutchinson is a 43 y.o. female here today for urinary incontinence.)  History of Present Illness: Holly Hutchinson is a 43 y.o. Black or African-American female seen in consultation at the request of Dr. Delma Fern for evaluation of incontinence.    Review of records significant for: Mixed stress and urge incontience  Urinary Symptoms: Leaks urine with with a full bladder, with movement to the bathroom, with urgency, and while asleep Leaks 2-3 time(s) per days.  Pad use: None Patient is bothered by UI symptoms.  Day time voids 5.  Nocturia: 2 times per night to void. Voiding dysfunction:  empties bladder well.  Patient does not use a catheter to empty bladder.  When urinating, patient feels dribbling after finishing Drinks: Body Armor drinks, 32oz Water, and 8oz Sweet tea per day  UTIs: 0 UTI's in the last year.   Denies history of blood in urine, kidney or bladder stones, pyelonephritis, bladder cancer, and kidney cancer No results found for the last 90 days.   Pelvic Organ Prolapse Symptoms:                  Patient Denies a feeling of a bulge the vaginal area.   Bowel Symptom: Bowel movements: 1-2 time(s) per day Stool consistency: soft  Straining: no.  Splinting: no.  Incomplete evacuation: no.  Patient Denies accidental bowel leakage / fecal incontinence Bowel regimen: none Last colonoscopy: Never [facility] Colonoscopy   This patient has no relevant Health Maintenance data.     Sexual Function Sexually active: no.  Sexual orientation: Straight Pain with sex: has discomfort due to dryness  Pelvic Pain Denies pelvic pain    Past Medical History:  Past Medical History:  Diagnosis Date   Bronchitis      Past Surgical History:    Past Surgical History:  Procedure Laterality Date   BREAST BIOPSY Left 04/2022   ECTOPIC PREGNANCY SURGERY       Past OB/GYN History: [id number] Vaginal deliveries: 3,  Forceps/ Vacuum deliveries: 1 with forceps, Cesarean section: 1 Menopausal: No, LMP No LMP recorded. Contraception: None. Last pap smear was 02/03/24.  Any history of abnormal pap smears: yes. [facility] PAP   This patient has no relevant Health Maintenance data.     Medications: Patient has a current medication list which includes the following prescription(s): cholecalciferol, fenugreek, magnesium , and multivitamin with minerals.   Allergies: Patient is allergic to erythromycin.   Social History:  Social History   Tobacco Use   Smoking status: Former    Current packs/day: 0.00    Types: Cigarettes    Quit date: 06/2020    Years since quitting: 3.9   Smokeless tobacco: Former  Building services engineer status: Never Used  Substance Use Topics   Alcohol use: Yes    Comment: occ   Drug use: No    Relationship status: divorced Patient lives with Son.   Patient is employed Lawyer. Regular exercise: No History of abuse: No  Family History:   Family History  Problem Relation Age of Onset   Asthma Mother    Breast cancer Maternal Grandmother        1s   Bladder Cancer Neg Hx    Uterine cancer Neg Hx    Renal cancer Neg Hx      Review  of Systems: Review of Systems  Constitutional:  Negative for chills and fever.  Respiratory:  Positive for wheezing. Negative for cough and shortness of breath.   Cardiovascular:  Negative for chest pain and palpitations.  Gastrointestinal:  Negative for abdominal pain, blood in stool, constipation and diarrhea.  Skin:  Negative for rash.  Neurological:  Positive for weakness.  Psychiatric/Behavioral:  Negative for depression and suicidal ideas.      OBJECTIVE Physical Exam: Vitals:   05/25/24 1020  BP: 108/64  Pulse: 75  Weight: 124 lb 6.4 oz (56.4 kg)  Height: 5' 2.8"  (1.595 m)    Physical Exam Vitals reviewed. Exam conducted with a chaperone present.  Constitutional:      Appearance: Normal appearance.  Pulmonary:     Effort: Pulmonary effort is normal.  Abdominal:     Palpations: Abdomen is soft.  Neurological:     General: No focal deficit present.     Mental Status: She is alert and oriented to person, place, and time.  Psychiatric:        Mood and Affect: Mood normal.        Behavior: Behavior normal. Behavior is cooperative.        Thought Content: Thought content normal.      GU / Detailed Urogynecologic Evaluation:  Pelvic Exam: Normal external female genitalia; Bartholin's and Skene's glands normal in appearance; urethral meatus normal in appearance, no urethral masses or discharge.   CST: negative  Speculum exam reveals normal vaginal mucosa without atrophy. Cervix normal appearance. Uterus normal single, nontender. Adnexa normal adnexa.       With apex supported, anterior compartment defect was reduced  Pelvic floor strength III/V  Pelvic floor musculature: Right levator non-tender, Right obturator non-tender, Left levator non-tender, Left obturator non-tender  POP-Q:   POP-Q  -2                                            Aa   -2                                           Ba  -5                                              C   3.5                                            Gh  5                                            Pb  7.5                                            tvl   -3  Ap  -3                                            Bp  -5.5                                              D      Rectal Exam:  Normal external exam  Post-Void Residual (PVR) by Bladder Scan: In order to evaluate bladder emptying, we discussed obtaining a postvoid residual and patient agreed to this procedure.  Procedure: The ultrasound unit was placed on the patient's abdomen in the  suprapubic region after the patient had voided.    Post Void Residual - 05/25/24 1034       Post Void Residual   Post Void Residual 4 mL              Laboratory Results: Lab Results  Component Value Date   COLORU yellow 05/25/2024   CLARITYU clear 05/25/2024   GLUCOSEUR negative 05/25/2024   BILIRUBINUR negative 05/25/2024   KETONESU negative 08/01/2021   SPECGRAV 1.015 05/25/2024   RBCUR moderate (A) 05/25/2024   PHUR 6.0 05/25/2024   PROTEINUR 30 (A) 04/03/2024   UROBILINOGEN 0.2 05/25/2024   LEUKOCYTESUR Negative 05/25/2024    Lab Results  Component Value Date   CREATININE 0.72 04/03/2024   CREATININE 0.57 11/09/2023   CREATININE 0.70 06/24/2023    No results found for: "HGBA1C"  Lab Results  Component Value Date   HGB 12.5 04/03/2024     ASSESSMENT AND PLAN Ms. Krogstad is a 43 y.o. with:  1. Urinary frequency   2. OAB (overactive bladder)   3. Vaginal dryness    We discussed the symptoms of overactive bladder (OAB), which include urinary urgency, urinary frequency, nocturia, with or without urge incontinence.  While we do not know the exact etiology of OAB, several treatment options exist. We discussed management including behavioral therapy (decreasing bladder irritants, urge suppression strategies, timed voids, bladder retraining), physical therapy, medication; for refractory cases posterior tibial nerve stimulation, sacral neuromodulation, and intravesical botulinum toxin injection. For anticholinergic medications, we discussed the potential side effects of anticholinergics including dry eyes, dry mouth, constipation, cognitive impairment and urinary retention. For Beta-3 agonist medication, we discussed the potential side effect of elevated blood pressure which is more likely to occur in individuals with uncontrolled hypertension. Patient is set to start PT in July and is open to trying pumpkin seed extract.We discussed that up to 5gm daily has been shown  to be effective for bladder support. We also discussed specific bladder irritants to reduce. We also discussed that if she continues to feel more nighttime urgency we can look more into sleep apnea possibility. Patient reports she has had significant vaginal dryness for a while. We discussed some natural alternatives for dryness include Aloe vera, vitamin E, cocoot oil, and olive oil. Patient reports she will give something a try for her dryness.   Patient to return after PT. If she is not well controlled will consider a more formal medication at that time.   Rahman Ferrall G Rhesa Forsberg, NP

## 2024-06-15 NOTE — Therapy (Signed)
 OUTPATIENT PHYSICAL THERAPY FEMALE PELVIC EVALUATION   Patient Name: Holly Hutchinson MRN: [id number] DOB:08/30/1981, 43 y.o., female Today's Date: 06/16/2024  END OF SESSION:  PT End of Session - 06/16/24 1024     Visit Number 1    Date for PT Re-Evaluation 12/15/24    Authorization Type [place] MEDICAID [employer]  no author req older than 21 yr    Progress Note Due on Visit --    PT Start Time 1015    PT Stop Time 1100    PT Time Calculation (min) 45 min    Activity Tolerance Patient tolerated treatment well    Behavior During Therapy WFL for tasks assessed/performed          Past Medical History:  Diagnosis Date   Bronchitis    Past Surgical History:  Procedure Laterality Date   BREAST BIOPSY Left 04/2022   ECTOPIC PREGNANCY SURGERY     Patient Active Problem List   Diagnosis Date Noted   Swollen lymph nodes 07/03/2023   Generalized abdominal pain 07/03/2023    PCP: Rudy Carlin LABOR, MD  REFERRING PROVIDER: Milly Olam LABOR, CNM  REFERRING DIAG: N39.46 (ICD-10-CM) - Mixed stress and urge incontinence  THERAPY DIAG:  Muscle weakness (generalized) - Plan: PT plan of care cert/re-cert  Rationale for Evaluation and Treatment: Rehabilitation  ONSET DATE: 12/2023  SUBJECTIVE:                                                                                                                                                                                           SUBJECTIVE STATEMENT: Pt reports that her muscles got weaker, has been going on about 3-4 months 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 No  OCCUPATION: CNA  ACTIVITY LEVEL : active  PLOF:  Independent  PATIENT GOALS: build stronger muscles  PERTINENT HISTORY:   Sexual abuse: No  BOWEL MOVEMENT: no issues   URINATION: Pain with urination: No Fully empty bladder: Yes:   Stream: Strong Urgency: Yes when she has to go, she has to go Frequency: 1-2hr Leakage: Urge to void and sometimes does not know Pads: Yes:    INTERCOURSE: not active   PREGNANCY: Vaginal deliveries 3 Tearing Yes:   Episiotomy No C-section deliveries 1 Currently pregnant No  PROLAPSE: None   OBJECTIVE:  Note: Objective measures were completed at Evaluation unless otherwise noted.  DIAGNOSTIC FINDINGS:  Urodynamics  PATIENT SURVEYS:    PFIQ-7: pt given questionnaire, will bring  COGNITION: Overall cognitive status: Within functional limits for tasks assessed     SENSATION: Light touch: Appears intact  LUMBAR SPECIAL TESTS:  Slump test: Negative for sciatic pain, positive for tightness   POSTURE: rounded shoulders, forward head, and increased lumbar lordosis   LUMBARAROM/PROM:   A/PROM A/PROM  eval  Flexion 75%  Extension   Right lateral flexion 75%  Left lateral flexion 75%  Right rotation full  Left rotation full   (Blank rows = not tested)  LOWER EXTREMITY ROM: full  LOWER EXTREMITY MMT: 4/5  PALPATION:   General: upper chest breathing and  abdominal diastasis present  Pelvic Alignment: even  Abdominal: C section scar restricted and gave pt urgency with palpation                External Perineal Exam: mild dryness present                             Internal Pelvic Floor: pt with good pelvic floor strength, coordination and able to do quick flicks, some bleeding present, tightness throughout first layers but no urgency reproduced  Patient confirms identification and approves PT to assess internal pelvic floor and treatment Yes Patient confirms identification and approves PT to assess internal pelvic floor and treatment Yes No emotional/communication  barriers or cognitive limitation. Patient is motivated to learn. Patient understands and agrees with treatment goals and plan. PT explains patient will be examined in standing, sitting, and lying down to see how their muscles and joints work. When they are ready, they will be asked to remove their underwear so PT can examine their perineum. The patient is also given the option of providing their own chaperone as one is not provided in our facility. The patient also has the right and is explained the right to defer or refuse any part of the evaluation or treatment including the internal exam. With the patient's consent, PT will use one gloved finger to gently assess the muscles of the pelvic floor, seeing how well it contracts and relaxes and if there is muscle symmetry. After, the patient will get dressed and PT and patient will discuss exam findings and plan of care. PT and patient discuss plan of care, schedule, attendance policy and HEP activities.     PELVIC MMT:   MMT eval  Vaginal 15 sec hold, 5/5   Internal Anal Sphincter   External Anal Sphincter   Puborectalis   Diastasis Recti 2 fingers at umbilicus and 1 above umbilicus  (Blank rows = not tested)        TONE: average  PROLAPSE: None noticed in hooklying  TODAY'S TREATMENT:  DATE: 06/16/2024  EVAL EVAL Examination completed, findings reviewed, pt educated on POC, HEP, and female pelvic floor anatomy, reasoning with pelvic floor assessment internally with pt consent. Pt motivated to participate in PT and agreeable to attempt recommendations.     PATIENT EDUCATION:  Education details: Pt was educated on relevant anatomy, exam findings, HEP, expectations of PT   Person educated: Patient Education method: Explanation, Demonstration, Tactile cues, Verbal cues, and Handouts Education comprehension: verbalized  understanding, returned demonstration, verbal cues required, tactile cues required, and needs further education  HOME EXERCISE PROGRAM: Access Code: [email or web address] URL: [email or web address] Date: 06/16/2024 Prepared by: Cori Ronnie Mallette  Patient Education - Urinary Urge Control Techniques - Bladder Retraining Strategies - Urinary Incontinence - Lifestyle Changes to Support Urinary and Bladder Health  ASSESSMENT:  CLINICAL IMPRESSION: Patient is a 43 y.o. F who was seen today for physical therapy evaluation and treatment for urge urinary incontinence. Exam findings are notable for good pelvic floor strength and coordination. Pt demonstrates decreased strength in her abdomen, had a large DRA, tightness in lumbar paraspinals, reduced range or motion in lumbar spine and pain in bilateral knees and low back. Symptoms- urinary urgency  is irritated by abdominal c section gentle scar massage and palpation. 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 She was educated today on abdominal scar massage, relevant anatomy, healthy bladder habits and urge suppression techniques. Discussed ortho PT as well. Pt is frustrated and will benefit from physical therapy to address deficits.    OBJECTIVE IMPAIRMENTS: decreased strength, increased fascial restrictions, increased muscle spasms, and pain.   ACTIVITY LIMITATIONS: carrying, bending, continence, and toileting  PARTICIPATION LIMITATIONS: occupation  PERSONAL FACTORS: Behavior pattern and Time since onset of injury/illness/exacerbation are also affecting patient's functional outcome.   REHAB POTENTIAL: Good  CLINICAL DECISION MAKING: Evolving/moderate complexity  EVALUATION COMPLEXITY: Moderate   GOALS: Goals reviewed with patient? Yes  SHORT TERM GOALS: Target date: 07/14/2024    Pt will fill out a bladder diary to identify irritants and bladder  habits Baseline: Goal status: INITIAL  2.  Pt will be educated on urge suppression techniques Baseline:  Goal status: INITIAL  3.  Pt will be educated on TTNS Baseline:  Goal status: INITIAL  4.  Pt will be independent with HEP.   Baseline:  Goal status: INITIAL  5.  Pt will be educated on healthy bladder habits Baseline:  Goal status: INITIAL  6.  Pt will be independent with HEP.   Baseline:  Goal status: INITIAL  LONG TERM GOALS: Target date: 12/15/2024  Pt will be independent with advanced HEP.   Baseline:  Goal status: INITIAL  2.  Pt will soak 0 pads/ day in order to run errands and not have to interrupt daily tasks.  Baseline:  Goal status: INITIAL  3.   Pt will demonstrate appropriate lateral rib cage excursion with inhale to ensure better abdominal pressure management and pelvic floor/abdominal muscle relaxation.   Baseline:  Goal status: INITIAL  4.  Pt will be independent with diaphragmatic breathing and down training activities in order to improve pelvic floor relaxation.  Baseline:  Goal status: INITIAL  5.  Pt will be independent with the knack, urge suppression technique, and double voiding in order to improve bladder habits and decrease urinary incontinence.   Baseline:  Goal status: INITIAL   PLAN:  PT FREQUENCY: 1-2x/week  PT DURATION: 6 months  PLANNED INTERVENTIONS: 97110-Therapeutic exercises,  02469- Therapeutic activity, [id number]- Neuromuscular re-education, [phone]- Self Care, [id number]- Manual therapy, [phone]- Electrical stimulation (manual), [phone] (1-2 muscles), [id number] (3+ muscles)- Dry Needling, Patient/Family education, Taping, Joint mobilization, Joint manipulation, Spinal manipulation, Spinal mobilization, Scar mobilization, Cryotherapy, Moist heat, and Biofeedback  PLAN FOR NEXT SESSION: diaphragmatic breathing reed, TTNS, urge suppression, bladder diary, exercises for DRA and low back pain   Rylah Fukuda, PT 06/16/2024, 12:16 PM

## 2024-06-16 ENCOUNTER — Other Ambulatory Visit: Payer: Self-pay

## 2024-06-16 ENCOUNTER — Encounter: Payer: Self-pay | Admitting: Physical Therapy

## 2024-06-16 ENCOUNTER — Ambulatory Visit: Attending: Advanced Practice Midwife | Admitting: Physical Therapy

## 2024-06-16 DIAGNOSIS — M6281 Muscle weakness (generalized): Secondary | ICD-10-CM | POA: Diagnosis present

## 2024-06-16 DIAGNOSIS — N3946 Mixed incontinence: Secondary | ICD-10-CM | POA: Diagnosis not present

## 2024-06-17 ENCOUNTER — Encounter: Payer: Self-pay | Admitting: Dermatology

## 2024-06-17 ENCOUNTER — Ambulatory Visit: Admitting: Dermatology

## 2024-06-17 VITALS — BP 136/87 | HR 86

## 2024-06-17 DIAGNOSIS — R2232 Localized swelling, mass and lump, left upper limb: Secondary | ICD-10-CM | POA: Diagnosis not present

## 2024-06-17 DIAGNOSIS — R229 Localized swelling, mass and lump, unspecified: Secondary | ICD-10-CM

## 2024-06-17 NOTE — Progress Notes (Signed)
   New Patient Visit   Subjective  Holly Hutchinson is a 43 y.o. female who presents for the following: Here to talk about treatment options for a possible lipoma on left arm. No hx of skin cancer or family hx. Lesion on left arm present for several years, growing and changing, not previously treated.  The following portions of the chart were reviewed this encounter and updated as appropriate: medications, allergies, medical history  Review of Systems:  No other skin or systemic complaints except as noted in HPI or Assessment and Plan.  Objective  Well appearing patient in no apparent distress; mood and affect are within normal limits.   A focused examination was performed of the following areas: Left arm  Relevant exam findings are noted in the Assessment and Plan.    Assessment & Plan   Likely Lipoma  Exam: Subcutaneous rubbery nodule(s) Location: Let upper arm; Size 5.5 x 6.0 cm  Benign-appearing. Exam most consistent with an Lipoma. Discussed that a Lipoma is a benign fatty growth that can grow over time and sometimes become painful or otherwise symptomatic. Some patients may have one or several lipomas.. Benign Hereditary Lipomatosis is a hereditary familial condition where family members tend to grow multiple lipomas.  Recommend observation if it is not changing, growing or symptomatic. Recommend surgical excision to remove it if it is painful, growing, symptomatic, or other changes noted. Please contact our office for new or changing lesions so they can be evaluated. Recommend ultrasound for definitive discussion about removal.    SUBCUTANEOUS NODULE   Related Procedures US  LT UPPER EXTREM LTD SOFT TISSUE NON VASCULAR  Return for based on ultrasound results.  I, Berwyn Lesches, Surg Tech III, am acting as scribe for RUFUS CHRISTELLA HOLY, MD.   Documentation: I have reviewed the above documentation for accuracy and completeness, and I agree with the above.  RUFUS CHRISTELLA HOLY,  MD

## 2024-06-17 NOTE — Patient Instructions (Signed)
 Call [phone] for radiology appointment

## 2024-06-23 ENCOUNTER — Ambulatory Visit (HOSPITAL_COMMUNITY)
Admission: RE | Admit: 2024-06-23 | Discharge: 2024-06-23 | Disposition: A | Discharge status: Home / Self Care | Source: Ambulatory Visit | Attending: Dermatology | Admitting: Dermatology

## 2024-06-23 DIAGNOSIS — R2232 Localized swelling, mass and lump, left upper limb: Secondary | ICD-10-CM | POA: Diagnosis not present

## 2024-06-23 DIAGNOSIS — R229 Localized swelling, mass and lump, unspecified: Secondary | ICD-10-CM | POA: Insufficient documentation

## 2024-06-25 ENCOUNTER — Ambulatory Visit: Admitting: Physical Therapy

## 2024-06-25 ENCOUNTER — Encounter: Payer: Self-pay | Admitting: Physical Therapy

## 2024-06-25 DIAGNOSIS — M6281 Muscle weakness (generalized): Secondary | ICD-10-CM | POA: Diagnosis not present

## 2024-06-25 NOTE — Therapy (Signed)
 OUTPATIENT PHYSICAL THERAPY FEMALE PELVIC TREATMENT   Patient Name: Holly Hutchinson MRN: [id number] DOB:1981/10/11, 43 y.o., female Today's Date: 06/25/2024  END OF SESSION:  PT End of Session - 06/25/24 0942     Visit Number 2    Date for PT Re-Evaluation 12/15/24    Authorization Type [place] MEDICA[employer]  no author req older than 21 yr    PT Start Time 0940    PT Stop Time 1015    PT Time Calculation (min) 35 min    Activity Tolerance Patient tolerated treatment well    Behavior During Therapy [facility] for tasks assessed/performed           Past Medical History:  Diagnosis Date   Bronchitis    Past Surgical History:  Procedure Laterality Date   BREAST BIOPSY Left 04/2022   ECTOPIC PREGNANCY SURGERY     Patient Active Problem List   Diagnosis Date Noted   Swollen lymph nodes 07/03/2023   Generalized abdominal pain 07/03/2023    PCP: Rudy Carlin LABOR, MD  REFERRING PROVIDER: Milly Olam LABOR, CNM  REFERRING DIAG: N39.46 (ICD-10-CM) - Mixed stress and urge incontinence  THERAPY DIAG:  Muscle weakness (generalized)  Rationale for Evaluation and Treatment: Rehabilitation  ONSET DATE: 12/2023  SUBJECTIVE:                                                                                                                                                                                           SUBJECTIVE STATEMENT: Patient reports that her back hurts, she felt good after  last visit, no vaginal irritation.       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 No  OCCUPATION:  CNA  ACTIVITY LEVEL : active  PLOF: Independent  PATIENT GOALS: build stronger muscles  PERTINENT HISTORY:   Sexual abuse: No  BOWEL MOVEMENT: no issues   URINATION: Pain with urination: No Fully empty bladder: Yes:   Stream: Strong Urgency: Yes when she has to go, she has to go Frequency: 1-2hr Leakage: Urge to void and sometimes does not know Pads: Yes:    INTERCOURSE: not active   PREGNANCY: Vaginal deliveries 3 Tearing Yes:   Episiotomy No C-section deliveries 1 Currently pregnant  No  PROLAPSE: None   OBJECTIVE:  Note: Objective measures were completed at Evaluation unless otherwise noted.  DIAGNOSTIC FINDINGS:  Urodynamics  PATIENT SURVEYS:    PFIQ-7: pt given questionnaire, will bring  COGNITION: Overall cognitive status: Within functional limits for tasks assessed     SENSATION: Light touch: Appears intact  LUMBAR SPECIAL TESTS:  Slump test: Negative for sciatic pain, positive for tightness   POSTURE: rounded shoulders, forward head, and increased lumbar lordosis   LUMBARAROM/PROM:   A/PROM A/PROM  eval  Flexion 75%  Extension   Right lateral flexion 75%  Left lateral flexion 75%  Right rotation full  Left rotation full   (Blank rows = not tested)  LOWER EXTREMITY ROM: full  LOWER EXTREMITY MMT: 4/5  PALPATION:   General: upper chest breathing and  abdominal diastasis present  Pelvic Alignment: even  Abdominal: C section scar restricted and gave pt urgency with palpation                External Perineal Exam: mild dryness present                             Internal Pelvic Floor: pt with good pelvic floor strength, coordination and able to do quick flicks, some bleeding present, tightness throughout first layers but no urgency reproduced  Patient confirms identification and approves PT to assess internal pelvic floor and treatment Yes Patient confirms identification and approves PT to assess internal pelvic floor and  treatment Yes No emotional/communication barriers or cognitive limitation. Patient is motivated to learn. Patient understands and agrees with treatment goals and plan. PT explains patient will be examined in standing, sitting, and lying down to see how their muscles and joints work. When they are ready, they will be asked to remove their underwear so PT can examine their perineum. The patient is also given the option of providing their own chaperone as one is not provided in our facility. The patient also has the right and is explained the right to defer or refuse any part of the evaluation or treatment including the internal exam. With the patient's consent, PT will use one gloved finger to gently assess the muscles of the pelvic floor, seeing how well it contracts and relaxes and if there is muscle symmetry. After, the patient will get dressed and PT and patient will discuss exam findings and plan of care. PT and patient discuss plan of care, schedule, attendance policy and HEP activities.     PELVIC MMT:   MMT eval  Vaginal 15 sec hold, 5/5   Internal Anal Sphincter   External Anal Sphincter   Puborectalis   Diastasis Recti 2 fingers at umbilicus and 1 above umbilicus  (Blank rows = not tested)        TONE: average  PROLAPSE: None noticed in hooklying  TODAY'S TREATMENT:  DATE:   7/10 Neurom reed- cat/cow                          Child's pose Manual- addaday 8 mins lumbar paraspinals               PA mobs Review of bladder irritants Review of healthy bladder habits  06/16/2024  EVAL EVAL Examination completed, findings reviewed, pt educated on POC, HEP, and female pelvic floor anatomy, reasoning with pelvic floor assessment internally with pt consent. Pt motivated to participate in PT and agreeable to attempt recommendations.     PATIENT EDUCATION:  Education  details: Pt was educated on relevant anatomy, exam findings, HEP, expectations of PT   Person educated: Patient Education method: Explanation, Demonstration, Tactile cues, Verbal cues, and Handouts Education comprehension: verbalized understanding, returned demonstration, verbal cues required, tactile cues required, and needs further education  HOME EXERCISE PROGRAM: Access Code: [email or web address] URL: [email or web address] Date: 06/16/2024 Prepared by: Cori Yaiden Yang  Patient Education - Urinary Urge Control Techniques - Bladder Retraining Strategies - Urinary Incontinence - Lifestyle Changes to Support Urinary and Bladder Health  ASSESSMENT:  CLINICAL IMPRESSION: Patient with tight lumbar paraspinals and limited AROM throughout low back, she reported that urgency is better this week.  She has had significant low back pain. Discussed adding ortho PT.  Patient will benefit from cont PT   From eval Patient is a 42 y.o. F who was seen today for physical therapy evaluation and treatment for urge urinary incontinence. Exam findings are notable for good pelvic floor strength and coordination. Pt demonstrates decreased strength in her abdomen, had a large DRA, tightness in lumbar paraspinals, reduced range or motion in lumbar spine and pain in bilateral knees and low back. Symptoms- urinary urgency  is irritated by abdominal c section gentle scar massage and palpation. 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 She was educated today on abdominal scar massage, relevant anatomy, healthy bladder habits and urge suppression techniques. Discussed ortho PT as well. Pt is frustrated and will benefit from physical therapy to address deficits.    OBJECTIVE IMPAIRMENTS: decreased strength, increased fascial restrictions, increased muscle spasms, and pain.   ACTIVITY LIMITATIONS: carrying, bending, continence, and  toileting  PARTICIPATION LIMITATIONS: occupation  PERSONAL FACTORS: Behavior pattern and Time since onset of injury/illness/exacerbation are also affecting patient's functional outcome.   REHAB POTENTIAL: Good  CLINICAL DECISION MAKING: Evolving/moderate complexity  EVALUATION COMPLEXITY: Moderate   GOALS: Goals reviewed with patient? Yes  SHORT TERM GOALS: Target date: 07/14/2024    Pt will fill out a bladder diary to identify irritants and bladder habits Baseline: Goal status: INITIAL  2.  Pt will be educated on urge suppression techniques Baseline:  Goal status: INITIAL  3.  Pt will be educated on TTNS Baseline:  Goal status: INITIAL  4.  Pt will be independent with HEP.   Baseline:  Goal status: INITIAL  5.  Pt will be educated on healthy bladder habits Baseline:  Goal status: INITIAL  6.  Pt will be independent with HEP.   Baseline:  Goal status: INITIAL  LONG TERM GOALS: Target date: 12/15/2024  Pt will be independent with advanced HEP.   Baseline:  Goal status: INITIAL  2.  Pt will soak 0 pads/ day in order to run errands and not have to interrupt daily tasks.  Baseline:  Goal status: INITIAL  3.  Pt will demonstrate appropriate lateral rib cage excursion with inhale to ensure better abdominal pressure management and pelvic floor/abdominal muscle relaxation.   Baseline:  Goal status: INITIAL  4.  Pt will be independent with diaphragmatic breathing and down training activities in order to improve pelvic floor relaxation.  Baseline:  Goal status: INITIAL  5.  Pt will be independent with the knack, urge suppression technique, and double voiding in order to improve bladder habits and decrease urinary incontinence.   Baseline:  Goal status: INITIAL   PLAN:  PT FREQUENCY: 1-2x/week  PT DURATION: 6 months  PLANNED INTERVENTIONS: 97110-Therapeutic exercises, 97530- Therapeutic activity, 97112- Neuromuscular re-education, 97535- Self Care, [id number]-  Manual therapy, [phone]- Electrical stimulation (manual), [phone] (1-2 muscles), [phone] (3+ muscles)- Dry Needling, Patient/Family education, Taping, Joint mobilization, Joint manipulation, Spinal manipulation, Spinal mobilization, Scar mobilization, Cryotherapy, Moist heat, and Biofeedback  PLAN FOR NEXT SESSION: diaphragmatic breathing reed, TTNS, urge suppression, bladder diary, exercises for DRA and low back pain   Suzzanne Brunkhorst, PT 06/25/2024, 9:46 AM

## 2024-06-28 ENCOUNTER — Ambulatory Visit: Payer: Self-pay | Admitting: Dermatology

## 2024-06-29 NOTE — Telephone Encounter (Signed)
-----   Message from [facility] PACI sent at 06/28/2024  8:45 AM EDT ----- Likely lipoma, likely benign. Patient will need to decide if she wants it removed. Will leave a scar ----- Message ----- From: Interface, Rad Results In Sent: 06/24/2024   8:58 AM EDT To: Karina M Paci, MD

## 2024-06-29 NOTE — Telephone Encounter (Signed)
 I called and went over the results with the patient. I let her know it was her choice to remove the possible lipoma or not but it would leave a scar.  She said she understood and wanted to proceed with removal. Sent to scheduling.

## 2024-07-01 ENCOUNTER — Ambulatory Visit: Admitting: Physical Therapy

## 2024-07-01 ENCOUNTER — Encounter: Payer: Self-pay | Admitting: Physical Therapy

## 2024-07-01 DIAGNOSIS — M6281 Muscle weakness (generalized): Secondary | ICD-10-CM

## 2024-07-01 NOTE — Therapy (Signed)
 OUTPATIENT PHYSICAL THERAPY FEMALE PELVIC TREATMENT   Patient Name: Holly Hutchinson MRN: [id number] DOB:1981-01-30, 43 y.o., female Today's Date: 07/01/2024  END OF SESSION:  PT End of Session - 07/01/24 1238     Visit Number 3    Date for PT Re-Evaluation 12/15/24    Authorization Type [place] MEDICAID [employer]  no author req older than 21 yr    Authorization - Visit Number 3    Authorization - Number of Visits 27    PT Start Time 1238    PT Stop Time 1318    PT Time Calculation (min) 40 min    Activity Tolerance Patient tolerated treatment well    Behavior During Therapy WFL for tasks assessed/performed            Past Medical History:  Diagnosis Date   Bronchitis    Past Surgical History:  Procedure Laterality Date   BREAST BIOPSY Left 04/2022   ECTOPIC PREGNANCY SURGERY     Patient Active Problem List   Diagnosis Date Noted   Swollen lymph nodes 07/03/2023   Generalized abdominal pain 07/03/2023    PCP: Rudy Carlin LABOR, MD  REFERRING PROVIDER: Milly Olam LABOR, CNM  REFERRING DIAG: N39.46 (ICD-10-CM) - Mixed stress and urge incontinence  THERAPY DIAG:  Muscle weakness (generalized)  Rationale for Evaluation and Treatment: Rehabilitation  ONSET DATE: 12/2023  SUBJECTIVE:                                                                                                                                                                                           SUBJECTIVE STATEMENT:  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 Pain is low back. Pain is burning sometimes with massage and sometimes just an ache. Greater than 10/10. Stretching has helped. The massage gun helped a lot. Have only needed massage by son twice since then when I get home from work.      Pt reports that her muscles got weaker, has been going on about 3-4 months Works as a Lawyer Has had some sciatic nerve pain, knee and ankle  pain, gets massages from her son  Fluid intake: ice tea- but reduced, water, sprite  PAIN: low back ( 10/10) and upper traps, knees and ankles  PRECAUTIONS: None  RED FLAGS: None   WEIGHT BEARING RESTRICTIONS: No  FALLS:  Has patient fallen in last 6 months? No  OCCUPATION: CNA  ACTIVITY LEVEL : active  PLOF: Independent  PATIENT GOALS: build stronger muscles  PERTINENT HISTORY:   Sexual abuse: No  BOWEL MOVEMENT: no issues  URINATION: Pain with urination: No Fully empty bladder: Yes:   Stream: Strong Urgency: Yes when she has to go, she has to go Frequency: 1-2hr Leakage: Urge to void and sometimes does not know Pads: Yes:    INTERCOURSE: not active   PREGNANCY: Vaginal deliveries 3 Tearing Yes:   Episiotomy No C-section deliveries 1 Currently pregnant No  PROLAPSE: None   OBJECTIVE:  Note: Objective measures were completed at Evaluation unless otherwise noted.  DIAGNOSTIC FINDINGS:  Urodynamics  PATIENT SURVEYS:    PFIQ-7: pt given questionnaire, will bring  COGNITION: Overall cognitive status: Within functional limits for tasks assessed     SENSATION: Light touch: Appears intact  LUMBAR SPECIAL TESTS:  Slump test: Negative for sciatic pain, positive for tightness   POSTURE: rounded shoulders, forward head, and increased lumbar lordosis   LUMBARAROM/PROM:   A/PROM A/PROM  eval 07/01/24  Flexion 75% full  Extension  Full   Right lateral flexion 75% Poor quality of movement - pain  Left lateral flexion 75% WNL some pain  Right rotation full full  Left rotation full Full    (Blank rows = not tested)  LOWER EXTREMITY ROM: full  LOWER EXTREMITY MMT: 4/5  PALPATION:   General: upper chest breathing and  abdominal diastasis present  Pelvic Alignment: even  Abdominal: C section scar restricted and gave pt urgency with palpation                External Perineal Exam: mild dryness present                              Internal Pelvic Floor: pt with good pelvic floor strength, coordination and able to do quick flicks, some bleeding present, tightness throughout first layers but no urgency reproduced  Patient confirms identification and approves PT to assess internal pelvic floor and treatment Yes Patient confirms identification and approves PT to assess internal pelvic floor and treatment Yes No emotional/communication barriers or cognitive limitation. Patient is motivated to learn. Patient understands and agrees with treatment goals and plan. PT explains patient will be examined in standing, sitting, and lying down to see how their muscles and joints work. When they are ready, they will be asked to remove their underwear so PT can examine their perineum. The patient is also given the option of providing their own chaperone as one is not provided in our facility. The patient also has the right and is explained the right to defer or refuse any part of the evaluation or treatment including the internal exam. With the patient's consent, PT will use one gloved finger to gently assess the muscles of the pelvic floor, seeing how well it contracts and relaxes and if there is muscle symmetry. After, the patient will get dressed and PT and patient will discuss exam findings and plan of care. PT and patient discuss plan of care, schedule, attendance policy and HEP activities.     PELVIC MMT:   MMT eval  Vaginal 15 sec hold, 5/5   Internal Anal Sphincter   External Anal Sphincter   Puborectalis   Diastasis Recti 2 fingers at umbilicus and 1 above umbilicus  (Blank rows = not tested)  07/01/24 LE MMT: hips 4+ to 5/5;  core weakness with hip flexor testing B  Muscle length: R piriformis, B HS L>R, B hip flexors  UPA mobs tight R lumbar, mild pain L lumbar  TTP: B gluteals and piriformis  TONE:  average  PROLAPSE: None noticed in hooklying  TODAY'S TREATMENT:                                                                                                                               DATE:   07/01/24 LB assessed HS stretch with strap 2x30 sec B ITB stretch with strap 2x30 sec B Seated fig 4 x 30 sec B Plank - unable to do Modified plank on elbows and knees with tactile cues to decrease lumbar lordosis Quadruped alt hip ext with tactile cues for neutral spine when lifting left LE Discussion of lordotic posture and using pelvic floor muscles and TA contraction to help neutralize spine.    7/10 Neurom reed- cat/cow                          Child's pose Manual- addaday 8 mins lumbar paraspinals               PA mobs Review of bladder irritants Review of healthy bladder habits  06/16/2024  EVAL EVAL Examination completed, findings reviewed, pt educated on POC, HEP, and female pelvic floor anatomy, reasoning with pelvic floor assessment internally with pt consent. Pt motivated to participate in PT and agreeable to attempt recommendations.     PATIENT EDUCATION:  Education details: Pt was educated on relevant anatomy, exam findings, HEP, expectations of PT   Person educated: Patient Education method: Explanation, Demonstration, Tactile cues, Verbal cues, and Handouts Education comprehension: verbalized understanding, returned demonstration, verbal cues required, tactile cues required, and needs further education  HOME EXERCISE PROGRAM: Access Code: [email or web address] URL: [email or web address] Date: 07/01/2024 Prepared by: Mliss  Program Notes exhale before you lift into the bridge  Exercises - Cat Cow  - 1 x daily - 7 x weekly - 3 sets - 10 reps - Supine Lower Trunk Rotation  - 1 x daily - 7 x weekly - 3 sets - 10 reps - Child's Pose Stretch  - 1 x daily - 7 x weekly - 3 sets - 10 reps - Supine Bridge  - 1 x daily - 7 x weekly - 3 sets - 10 reps - Supine Hamstring Stretch with Strap  - 2 x daily - 7 x weekly - 2 sets - 3 reps - 30 sec hold - Supine ITB Stretch with Strap  - 2 x daily - 7  x weekly - 1 sets - 3 reps - 60 sec hold - Seated Piriformis Stretch with Trunk Bend  - 2 x daily - 7 x weekly - 1 sets - 3 reps - 30-60 sec  hold - Plank on Knees  - 1 x daily - 7 x weekly - 1 sets - 5 reps - max hold hold - Beginner Front Arm Support  - 1 x daily - 3 x weekly - 2 sets - 10 reps - 5 sec hold  Patient Education - Urinary Urge Control  Techniques - Bladder Retraining Strategies - Urinary Incontinence - Lifestyle Changes to Support Urinary and Bladder Health  ASSESSMENT:  CLINICAL IMPRESSION: Patient presents with ongoing LBP. She states her urge incontinence is better. She demonstrates significant lumbar lordosis, weakness in her deep core muscles and tightness in her HS, R piriformis and hip flexors. She has some discomfort with UPA mobs on the L and tightness on the R. She has poor quality of movement and pain with right lateral SB. She is very TTP in B gluteals and piriformis muscles. She was challenged with modified plank and quadruped lumbar stab today. She will benefit from ongoing skilled PT to address these deficits.  She also reports upper back and UT pain. She has good upper back posture.    From eval Patient is a 43 y.o. F who was seen today for physical therapy evaluation and treatment for urge urinary incontinence. Exam findings are notable for good pelvic floor strength and coordination. Pt demonstrates decreased strength in her abdomen, had a large DRA, tightness in lumbar paraspinals, reduced range or motion in lumbar spine and pain in bilateral knees and low back. Symptoms- urinary urgency  is irritated by abdominal c section gentle scar massage and palpation. 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 She was educated today on abdominal scar massage, relevant anatomy, healthy bladder habits and urge suppression techniques. Discussed ortho PT as well. Pt is frustrated and will benefit from  physical therapy to address deficits.    OBJECTIVE IMPAIRMENTS: decreased strength, increased fascial restrictions, increased muscle spasms, and pain.   ACTIVITY LIMITATIONS: carrying, bending, continence, and toileting  PARTICIPATION LIMITATIONS: occupation  PERSONAL FACTORS: Behavior pattern and Time since onset of injury/illness/exacerbation are also affecting patient's functional outcome.   REHAB POTENTIAL: Good  CLINICAL DECISION MAKING: Evolving/moderate complexity  EVALUATION COMPLEXITY: Moderate   GOALS: Goals reviewed with patient? Yes  SHORT TERM GOALS: Target date: 07/14/2024    Pt will fill out a bladder diary to identify irritants and bladder habits Baseline: Goal status: INITIAL  2.  Pt will be educated on urge suppression techniques Baseline:  Goal status: INITIAL  3.  Pt will be educated on TTNS Baseline:  Goal status: INITIAL  4.  Pt will be independent with HEP.   Baseline:  Goal status: INITIAL  5.  Pt will be educated on healthy bladder habits Baseline:  Goal status: INITIAL  6.  Pt will be independent with HEP.   Baseline:  Goal status: INITIAL  LONG TERM GOALS: Target date: 12/15/2024  Pt will be independent with advanced HEP.   Baseline:  Goal status: INITIAL  2.  Pt will soak 0 pads/ day in order to run errands and not have to interrupt daily tasks.  Baseline:  Goal status: INITIAL  3.   Pt will demonstrate appropriate lateral rib cage excursion with inhale to ensure better abdominal pressure management and pelvic floor/abdominal muscle relaxation.   Baseline:  Goal status: INITIAL  4.  Pt will be independent with diaphragmatic breathing and down training activities in order to improve pelvic floor relaxation.  Baseline:  Goal status: INITIAL  5.  Pt will be independent with the knack, urge suppression technique, and double voiding in order to improve bladder habits and decrease urinary incontinence.   Baseline:  Goal status:  INITIAL  6.  Decreased LBP with ADLs by 75% to improved QOL and work requirements.  Baseline:  Goal status: INITIAL  7.  Patient to demonstrate improved core strength by being able to complete a plank for 30 seconds. Baseline:  Goal status: INITIAL       PLAN:  PT FREQUENCY: 1-2x/week  PT DURATION: 6 months  PLANNED INTERVENTIONS: 97110-Therapeutic exercises, 97530- Therapeutic activity, 97112- Neuromuscular re-education, 97535- Self Care, [id number]- Manual therapy, [phone]- Electrical stimulation (manual), [phone] (1-2 muscles), 20561 (3+ muscles)- Dry Needling, Patient/Family education, Taping, Joint mobilization, Joint manipulation, Spinal manipulation, Spinal mobilization, Scar mobilization, Cryotherapy, Moist heat, and Biofeedback  PLAN FOR NEXT SESSION: diaphragmatic breathing reed, TTNS, urge suppression, bladder diary, exercises for DRA and low back pain   Mliss Cummins, PT 07/01/24 1:52 PM

## 2024-07-09 ENCOUNTER — Ambulatory Visit

## 2024-07-14 ENCOUNTER — Ambulatory Visit: Admitting: Physical Therapy

## 2024-07-14 ENCOUNTER — Telehealth: Payer: Self-pay | Admitting: Physical Therapy

## 2024-07-14 NOTE — Telephone Encounter (Signed)
 Called patient re Np show, she was at work, patient to call us  and schedule visits on her days off as she can [phone]

## 2024-07-16 NOTE — Therapy (Incomplete)
 OUTPATIENT PHYSICAL THERAPY FEMALE PELVIC TREATMENT   Patient Name: Holly Hutchinson MRN: [id number] DOB:02-24-1981, 43 y.o., female Today's Date: 07/16/2024  END OF SESSION:      Past Medical History:  Diagnosis Date   Bronchitis    Past Surgical History:  Procedure Laterality Date   BREAST BIOPSY Left 04/2022   ECTOPIC PREGNANCY SURGERY     Patient Active Problem List   Diagnosis Date Noted   Swollen lymph nodes 07/03/2023   Generalized abdominal pain 07/03/2023    PCP: Rudy Carlin LABOR, MD  REFERRING PROVIDER: Milly Olam LABOR, CNM  REFERRING DIAG: N39.46 (ICD-10-CM) - Mixed stress and urge incontinence  THERAPY DIAG:  No diagnosis found.  Rationale for Evaluation and Treatment: Rehabilitation  ONSET DATE: 12/2023  SUBJECTIVE:                                                                                                                                                                                           SUBJECTIVE STATEMENT: ***      Pt reports that her muscles got weaker, has been going on about 3-4 months 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 No  OCCUPATION: CNA  ACTIVITY LEVEL : active  PLOF: Independent  PATIENT GOALS: build stronger muscles  PERTINENT HISTORY:   Sexual abuse: No  BOWEL MOVEMENT: no issues   URINATION: Pain with urination: No Fully empty bladder: Yes:   Stream: Strong Urgency: Yes when she has to go, she has to go Frequency: 1-2hr Leakage: Urge to void and sometimes does not know Pads: Yes:    INTERCOURSE: not active   PREGNANCY: Vaginal deliveries 3 Tearing Yes:   Episiotomy No C-section deliveries  1 Currently pregnant No  PROLAPSE: None   OBJECTIVE:  Note: Objective measures were completed at Evaluation unless otherwise noted.  DIAGNOSTIC FINDINGS:  Urodynamics  PATIENT SURVEYS:    PFIQ-7: pt given questionnaire, will bring  COGNITION: Overall cognitive status: Within functional limits for tasks assessed     SENSATION: Light touch: Appears intact  LUMBAR SPECIAL TESTS:  Slump test: Negative for sciatic pain, positive for tightness   POSTURE: rounded shoulders, forward head, and increased lumbar lordosis   LUMBARAROM/PROM:   A/PROM A/PROM  eval 07/01/24  Flexion 75% full  Extension  Full   Right lateral flexion  75% Poor quality of movement - pain  Left lateral flexion 75% WNL some pain  Right rotation full full  Left rotation full Full    (Blank rows = not tested)  LOWER EXTREMITY ROM: full  LOWER EXTREMITY MMT: 4/5  PALPATION:   General: upper chest breathing and  abdominal diastasis present  Pelvic Alignment: even  Abdominal: C section scar restricted and gave pt urgency with palpation                External Perineal Exam: mild dryness present                             Internal Pelvic Floor: pt with good pelvic floor strength, coordination and able to do quick flicks, some bleeding present, tightness throughout first layers but no urgency reproduced  Patient confirms identification and approves PT to assess internal pelvic floor and treatment Yes Patient confirms identification and approves PT to assess internal pelvic floor and treatment Yes No emotional/communication barriers or cognitive limitation. Patient is motivated to learn. Patient understands and agrees with treatment goals and plan. PT explains patient will be examined in standing, sitting, and lying down to see how their muscles and joints work. When they are ready, they will be asked to remove their underwear so PT can examine their perineum. The patient is also given the option of  providing their own chaperone as one is not provided in our facility. The patient also has the right and is explained the right to defer or refuse any part of the evaluation or treatment including the internal exam. With the patient's consent, PT will use one gloved finger to gently assess the muscles of the pelvic floor, seeing how well it contracts and relaxes and if there is muscle symmetry. After, the patient will get dressed and PT and patient will discuss exam findings and plan of care. PT and patient discuss plan of care, schedule, attendance policy and HEP activities.     PELVIC MMT:   MMT eval  Vaginal 15 sec hold, 5/5   Internal Anal Sphincter   External Anal Sphincter   Puborectalis   Diastasis Recti 2 fingers at umbilicus and 1 above umbilicus  (Blank rows = not tested)  07/01/24 LE MMT: hips 4+ to 5/5;  core weakness with hip flexor testing B  Muscle length: R piriformis, B HS L>R, B hip flexors  UPA mobs tight R lumbar, mild pain L lumbar  TTP: B gluteals and piriformis  TONE: average  PROLAPSE: None noticed in hooklying  TODAY'S TREATMENT:                                                                                                                              DATE:   07/17/24 Nustep Work on neutral spine HS stretch with strap 2x30 sec B ITB stretch with strap 2x30 sec B Seated fig 4 x 30  sec B Plank - unable to do *** Modified plank on elbows and knees with tactile cues to decrease lumbar lordosis Quadruped alt hip ext with tactile cues for neutral spine when lifting left LE Dying bug IASTM  07/01/24 LB assessed HS stretch with strap 2x30 sec B ITB stretch with strap 2x30 sec B Seated fig 4 x 30 sec B Plank - unable to do Modified plank on elbows and knees with tactile cues to decrease lumbar lordosis Quadruped alt hip ext with tactile cues for neutral spine when lifting left LE Discussion of lordotic posture and using pelvic floor muscles and TA  contraction to help neutralize spine.    7/10 Neurom reed- cat/cow                          Child's pose Manual- addaday 8 mins lumbar paraspinals               PA mobs Review of bladder irritants Review of healthy bladder habits  06/16/2024  EVAL EVAL Examination completed, findings reviewed, pt educated on POC, HEP, and female pelvic floor anatomy, reasoning with pelvic floor assessment internally with pt consent. Pt motivated to participate in PT and agreeable to attempt recommendations.     PATIENT EDUCATION:  Education details: Pt was educated on relevant anatomy, exam findings, HEP, expectations of PT   Person educated: Patient Education method: Explanation, Demonstration, Tactile cues, Verbal cues, and Handouts Education comprehension: verbalized understanding, returned demonstration, verbal cues required, tactile cues required, and needs further education  HOME EXERCISE PROGRAM: Access Code: [email or web address] URL: [email or web address] Date: 07/01/2024 Prepared by: Mliss  Program Notes exhale before you lift into the bridge  Exercises - Cat Cow  - 1 x daily - 7 x weekly - 3 sets - 10 reps - Supine Lower Trunk Rotation  - 1 x daily - 7 x weekly - 3 sets - 10 reps - Child's Pose Stretch  - 1 x daily - 7 x weekly - 3 sets - 10 reps - Supine Bridge  - 1 x daily - 7 x weekly - 3 sets - 10 reps - Supine Hamstring Stretch with Strap  - 2 x daily - 7 x weekly - 2 sets - 3 reps - 30 sec hold - Supine ITB Stretch with Strap  - 2 x daily - 7 x weekly - 1 sets - 3 reps - 60 sec hold - Seated Piriformis Stretch with Trunk Bend  - 2 x daily - 7 x weekly - 1 sets - 3 reps - 30-60 sec  hold - Plank on Knees  - 1 x daily - 7 x weekly - 1 sets - 5 reps - max hold hold - Beginner Front Arm Support  - 1 x daily - 3 x weekly - 2 sets - 10 reps - 5 sec hold  Patient Education - Urinary Urge Control Techniques - Bladder Retraining Strategies - Urinary Incontinence - Lifestyle  Changes to Support Urinary and Bladder Health  ASSESSMENT:  CLINICAL IMPRESSION: ***Patient presents with ongoing LBP. She states her urge incontinence is better. She demonstrates significant lumbar lordosis, weakness in her deep core muscles and tightness in her HS, R piriformis and hip flexors. She has some discomfort with UPA mobs on the L and tightness on the R. She has poor quality of movement and pain with right lateral SB. She is very TTP in B gluteals and piriformis muscles. She was challenged with modified plank and  quadruped lumbar stab today. She will benefit from ongoing skilled PT to address these deficits.  She also reports upper back and UT pain. She has good upper back posture.    From eval Patient is a 43 y.o. F who was seen today for physical therapy evaluation and treatment for urge urinary incontinence. Exam findings are notable for good pelvic floor strength and coordination. Pt demonstrates decreased strength in her abdomen, had a large DRA, tightness in lumbar paraspinals, reduced range or motion in lumbar spine and pain in bilateral knees and low back. Symptoms- urinary urgency  is irritated by abdominal c section gentle scar massage and palpation. 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 She was educated today on abdominal scar massage, relevant anatomy, healthy bladder habits and urge suppression techniques. Discussed ortho PT as well. Pt is frustrated and will benefit from physical therapy to address deficits.    OBJECTIVE IMPAIRMENTS: decreased strength, increased fascial restrictions, increased muscle spasms, and pain.   ACTIVITY LIMITATIONS: carrying, bending, continence, and toileting  PARTICIPATION LIMITATIONS: occupation  PERSONAL FACTORS: Behavior pattern and Time since onset of injury/illness/exacerbation are also affecting patient's functional outcome.   REHAB POTENTIAL:  Good  CLINICAL DECISION MAKING: Evolving/moderate complexity  EVALUATION COMPLEXITY: Moderate   GOALS: Goals reviewed with patient? Yes  SHORT TERM GOALS: Target date: 07/14/2024    Pt will fill out a bladder diary to identify irritants and bladder habits Baseline: Goal status: INITIAL  2.  Pt will be educated on urge suppression techniques Baseline:  Goal status: INITIAL  3.  Pt will be educated on TTNS Baseline:  Goal status: INITIAL  4.  Pt will be independent with HEP.   Baseline:  Goal status: INITIAL  5.  Pt will be educated on healthy bladder habits Baseline:  Goal status: INITIAL  6.  Pt will be independent with HEP.   Baseline:  Goal status: INITIAL  LONG TERM GOALS: Target date: 12/15/2024  Pt will be independent with advanced HEP.   Baseline:  Goal status: INITIAL  2.  Pt will soak 0 pads/ day in order to run errands and not have to interrupt daily tasks.  Baseline:  Goal status: INITIAL  3.   Pt will demonstrate appropriate lateral rib cage excursion with inhale to ensure better abdominal pressure management and pelvic floor/abdominal muscle relaxation.   Baseline:  Goal status: INITIAL  4.  Pt will be independent with diaphragmatic breathing and down training activities in order to improve pelvic floor relaxation.  Baseline:  Goal status: INITIAL  5.  Pt will be independent with the knack, urge suppression technique, and double voiding in order to improve bladder habits and decrease urinary incontinence.   Baseline:  Goal status: INITIAL  6.  Decreased LBP with ADLs by 75% to improved QOL and work requirements.  Baseline:  Goal status: INITIAL  7.  Patient to demonstrate improved core strength by being able to complete a plank for 30 seconds. Baseline:  Goal status: INITIAL       PLAN:  PT FREQUENCY: 1-2x/week  PT DURATION: 6 months  PLANNED INTERVENTIONS: 97110-Therapeutic exercises, 97530- Therapeutic activity, 97112-  Neuromuscular re-education, 97535- Self Care, [id number]- Manual therapy, [phone]- Electrical stimulation (manual), [phone] (1-2 muscles), [phone] (3+ muscles)- Dry Needling, Patient/Family education, Taping, Joint mobilization, Joint manipulation, Spinal manipulation, Spinal mobilization, Scar mobilization, Cryotherapy, Moist heat, and Biofeedback  PLAN FOR NEXT SESSION: diaphragmatic breathing reed, TTNS,  urge suppression, bladder diary, exercises for DRA and low back pain   Mliss Cummins, PT 07/16/24 8:20 PM

## 2024-07-17 ENCOUNTER — Ambulatory Visit: Admitting: Physical Therapy

## 2024-07-20 ENCOUNTER — Ambulatory Visit: Admitting: Internal Medicine

## 2024-07-22 ENCOUNTER — Ambulatory Visit: Admitting: Internal Medicine

## 2024-07-22 NOTE — Progress Notes (Deleted)
 Name: Holly Hutchinson  MRN/ DOB: [id number], Jan 22, 1981    Age/ Sex: 43 y.o., female    PCP: Rudy Carlin LABOR, MD   Reason for Endocrinology Evaluation: Low TSH     Date of Initial Endocrinology Evaluation: 03/16/2022    HPI: [name] is a 43 y.o. female with unremarkable past medical history. The patient presented for initial endocrinology clinic visit on 03/16/2022  for consultative assistance with her low TSH.   The patient has a history of low TSH dating back to 2010.  She has hx of local neck swelling , she had a left prominent nodule in ~2020 but this has decreased to sub-centimeter measurement  TRAb undetectable Thyroid  ultrasound on 03/21/2022 revealed a solitary left subcentimeter thyroid  nodule  Maternal and paternal aunts with thyroid  disease     SUBJECTIVE:    Today (07/22/24):  [name] is here for a follow up on subclinical hyperthyroidism.  She had recent labs 01/2024 that show low TSH that she attributes to stress   Patient has been noted with weight loss She stopped her natural thyroid  supplements  She has noted brain fogs  Has noted slight increase in thyroid  nodule She had loose stools yesterday  No palpitations  No hand tremors     HISTORY:  Past Medical History:  Past Medical History:  Diagnosis Date   Bronchitis    Past Surgical History:  Past Surgical History:  Procedure Laterality Date   BREAST BIOPSY Left 04/2022   ECTOPIC PREGNANCY SURGERY      Social History:  reports that she quit smoking about 4 years ago. Her smoking use included cigarettes. She has quit using smokeless tobacco. She reports current alcohol use. She reports that she does not use drugs. Family History: family history includes Asthma in her mother; Breast cancer in her maternal grandmother.   HOME MEDICATIONS: Allergies as of 07/22/2024       Reactions   Erythromycin Hives, Swelling        Medication List        Accurate as of July 22, 2024  7:22 AM. If you have any questions, ask your nurse or doctor.          D3 ADULT PO Take by mouth.   FENUGREEK PO Take by mouth.   magnesium  30 MG tablet Take 30 mg by mouth 2 (two) times daily.   multivitamin with minerals Tabs tablet Take 1 tablet by mouth daily.          REVIEW OF SYSTEMS: A comprehensive ROS was conducted with the patient and is negative except as per HPI    OBJECTIVE:  VS: There were no vitals taken for this visit.   Wt Readings from Last 3 Encounters:  05/25/24 124 lb 6.4 oz (56.4 kg)  03/11/24 113 lb (51.3 kg)  02/03/24 114 lb 12.8 oz (52.1 kg)     EXAM: General: Pt appears well and is in NAD  Neck:  Thyroid : Thyroid  is prominent   Lungs: Clear with good BS bilat   Heart: Auscultation: RRR.  Abdomen: soft, nontender  Extremities:  BL LE: No pretibial edema   Mental Status: Judgment, insight: Intact Orientation: Oriented to time, place, and person Mood and affect: No depression, anxiety, or agitation     DATA REVIEWED:   Latest Reference Range & Units 02/03/24 15:08  TSH 0.450 - 4.500 uIU/mL 0.421 (L)  Triiodothyronine,Free,Serum 2.0 - 4.4 pg/mL 2.9  T4,Free(Direct) 0.82 - 1.77 ng/dL 9.15  (  L): Data is abnormally low   Thyroid  ultrasound 03/21/2022   Estimated total number of nodules >/= 1 cm: 0   Number of spongiform nodules >/=  2 cm not described below (TR1): 0   Number of mixed cystic and solid nodules >/= 1.5 cm not described below (TR2): 0   _________________________________________________________   Nodule # 1:   Location: Left; Mid   Maximum size: 0.6 cm; Other 2 dimensions: 0.6 x 0.6 cm   Composition: solid/almost completely solid (2)   Echogenicity: hypoechoic (2)   Shape: not taller-than-wide (0)   Margins: smooth (0)   Echogenic foci: none (0)   ACR TI-RADS total points: 4.   ACR TI-RADS risk category: TR4 (4-6 points).   ACR TI-RADS recommendations:   Given size (<0.9 cm) and  appearance, this nodule does NOT meet TI-RADS criteria for biopsy or dedicated follow-up.   _________________________________________________________   No cervical lymphadenopathy.   IMPRESSION: 1. Moderately enlarged thyroid  gland with normal sonographic appearance of the thyroid  parenchyma. 2. Solitary nodule in the left mid thyroid  (0.6 cm) which appears benign and does not meet criteria (TI-RADS category 4) for additional ultrasound follow-up or tissue sampling.   ASSESSMENT/PLAN/RECOMMENDATIONS:   Subclinical Hyperthyroidism  -Patient is clinically euthyroid -No local neck symptoms -Suspect autonomous nodule  - She is NOT keen on any medications if not necessary, supplements to help with her thyroid , she has been off of them, patient will return to taking the supplements . -Thyroid  ultrasound showed sub-centimeter left thyroid  nodule that did not require any follow up   Follow-up 4 months  Signed electronically by: Stefano Redgie Butts, MD  [facility] Endocrinology  [facility] [address] [place] [place], [place] [address] Phone: [phone] FAX: [phone]   CC: Rudy Carlin LABOR, MD [address] [place] [address] Phone: [phone] Fax: [phone]   Return to Endocrinology clinic as below: Future Appointments  Date Time Provider Department Center  07/22/2024 10:30 AM Jovannie Ulibarri, Donell Redgie, MD [facility] None  08/25/2024  9:20 AM Zuleta, Kaitlin G, NP [facility]

## 2024-07-24 ENCOUNTER — Encounter: Admitting: Physical Therapy

## 2024-08-25 ENCOUNTER — Ambulatory Visit: Admitting: Obstetrics and Gynecology

## 2024-09-29 ENCOUNTER — Other Ambulatory Visit: Payer: Self-pay | Admitting: Obstetrics

## 2024-09-29 DIAGNOSIS — R921 Mammographic calcification found on diagnostic imaging of breast: Secondary | ICD-10-CM

## 2024-10-15 ENCOUNTER — Ambulatory Visit
Admission: RE | Admit: 2024-10-15 | Discharge: 2024-10-15 | Disposition: A | Discharge status: Home / Self Care | Source: Ambulatory Visit | Attending: Obstetrics | Admitting: Obstetrics

## 2024-10-15 DIAGNOSIS — R921 Mammographic calcification found on diagnostic imaging of breast: Secondary | ICD-10-CM

## 2024-10-22 ENCOUNTER — Telehealth: Admitting: Obstetrics

## 2024-10-22 ENCOUNTER — Encounter: Payer: Self-pay | Admitting: Obstetrics

## 2024-10-22 VITALS — Ht 63.0 in | Wt 131.3 lb

## 2024-10-22 DIAGNOSIS — R921 Mammographic calcification found on diagnostic imaging of breast: Secondary | ICD-10-CM | POA: Diagnosis not present

## 2024-10-22 NOTE — Progress Notes (Signed)
 Pt presents for breast clip removal. Pt . Wants to know if it will leave scars on her breast. Pt is upset about having the surgery.

## 2024-10-22 NOTE — Progress Notes (Signed)
   TELEHEALTH GYNECOLOGY VISIT ENCOUNTER NOTE  Provider location: Center for [facility]   Patient location: Home  I connected with Holly Hutchinson on 10/22/24 at 10:55 AM EST by telephone and verified that I am speaking with the correct person using two identifiers. Patient was unable to do MyChart audiovisual encounter due to technical difficulties, she tried several times.    I discussed the limitations, risks, security and privacy concerns of performing an evaluation and management service by telephone and the availability of in person appointments. I also discussed with the patient that there may be a patient responsible charge related to this service. The patient expressed understanding and agreed to proceed.   History:  Holly Hutchinson is a 43 y.o. [phone] female being evaluated today for consultation for referral to General Surgery for breast clip removal.. She denies any abnormal vaginal discharge, bleeding, pelvic pain or other concerns.       Past Medical History:  Diagnosis Date   Bronchitis    Past Surgical History:  Procedure Laterality Date   BREAST BIOPSY Left 04/2022   ECTOPIC PREGNANCY SURGERY     The following portions of the patient's history were reviewed and updated as appropriate: allergies, current medications, past family history, past medical history, past social history, past surgical history and problem list.   Health Maintenance:  Normal pap and positive HRHPV on 01-18-2024.  Normal mammogram on 10-15-2024.   Review of Systems:  Pertinent items noted in HPI and remainder of comprehensive ROS otherwise negative.  Physical Exam:   General:  Alert, oriented and cooperative.   Mental Status: Normal mood and affect perceived. Normal judgment and thought content.  Physical exam deferred due to nature of the encounter  Labs and Imaging No results found for this or any previous visit (from the past 2 weeks). No results found.     Assessment and Plan:     1. Breast calcifications on mammogram (Primary) - breast clip placed for biopsy - patient is requesting removal of clip - will refer patient to General Surgery for Consultation for Removal of Clip     I discussed the assessment and treatment plan with the patient. The patient was provided an opportunity to ask questions and all were answered. The patient agreed with the plan and demonstrated an understanding of the instructions.   The patient was advised to call back or seek an in-person evaluation/go to the ED if the symptoms worsen or if the condition fails to improve as anticipated.  I have spent a total of 15 minutes of non-face-to-face time, excluding clinical staff time, reviewing notes and preparing to see patient, ordering tests and/or medications, and counseling the patient.    Carlin Centers, MD, [facility] for [facility], [facility], [place] 10/22/2024

## 2024-10-23 ENCOUNTER — Other Ambulatory Visit: Payer: Self-pay | Admitting: Obstetrics

## 2024-10-23 DIAGNOSIS — Z9889 Other specified postprocedural states: Secondary | ICD-10-CM

## 2024-12-04 ENCOUNTER — Encounter (HOSPITAL_BASED_OUTPATIENT_CLINIC_OR_DEPARTMENT_OTHER): Payer: Self-pay

## 2024-12-04 ENCOUNTER — Other Ambulatory Visit: Payer: Self-pay

## 2024-12-04 ENCOUNTER — Emergency Department (HOSPITAL_BASED_OUTPATIENT_CLINIC_OR_DEPARTMENT_OTHER)
Admission: EM | Admit: 2024-12-04 | Discharge: 2024-12-04 | Disposition: A | Discharge status: Home / Self Care | Attending: Emergency Medicine | Admitting: Emergency Medicine

## 2024-12-04 DIAGNOSIS — R112 Nausea with vomiting, unspecified: Secondary | ICD-10-CM | POA: Diagnosis not present

## 2024-12-04 DIAGNOSIS — R197 Diarrhea, unspecified: Secondary | ICD-10-CM | POA: Diagnosis not present

## 2024-12-04 DIAGNOSIS — R638 Other symptoms and signs concerning food and fluid intake: Secondary | ICD-10-CM | POA: Insufficient documentation

## 2024-12-04 DIAGNOSIS — R1084 Generalized abdominal pain: Secondary | ICD-10-CM | POA: Insufficient documentation

## 2024-12-04 LAB — URINALYSIS, ROUTINE W REFLEX MICROSCOPIC
Bilirubin Urine: NEGATIVE
Glucose, UA: NEGATIVE mg/dL
Ketones, ur: 40 mg/dL — AB
Leukocytes,Ua: NEGATIVE
Nitrite: NEGATIVE
Specific Gravity, Urine: 1.013 (ref 1.005–1.030)
pH: 6.5 (ref 5.0–8.0)

## 2024-12-04 LAB — CBC
HCT: 38.4 % (ref 36.0–46.0)
Hemoglobin: 13.1 g/dL (ref 12.0–15.0)
MCH: 26.8 pg (ref 26.0–34.0)
MCHC: 34.1 g/dL (ref 30.0–36.0)
MCV: 78.5 fL — ABNORMAL LOW (ref 80.0–100.0)
Platelets: 251 K/uL (ref 150–400)
RBC: 4.89 MIL/uL (ref 3.87–5.11)
RDW: 14.1 % (ref 11.5–15.5)
WBC: 7.3 K/uL (ref 4.0–10.5)
nRBC: 0 % (ref 0.0–0.2)

## 2024-12-04 LAB — COMPREHENSIVE METABOLIC PANEL WITH GFR
ALT: 27 U/L (ref 0–44)
AST: 68 U/L — ABNORMAL HIGH (ref 15–41)
Albumin: 4.7 g/dL (ref 3.5–5.0)
Alkaline Phosphatase: 71 U/L (ref 38–126)
Anion gap: 16 — ABNORMAL HIGH (ref 5–15)
BUN: 15 mg/dL (ref 6–20)
CO2: 22 mmol/L (ref 22–32)
Calcium: 9.8 mg/dL (ref 8.9–10.3)
Chloride: 96 mmol/L — ABNORMAL LOW (ref 98–111)
Creatinine, Ser: 0.6 mg/dL (ref 0.44–1.00)
GFR, Estimated: >60 mL/min
Glucose, Bld: 111 mg/dL — ABNORMAL HIGH (ref 70–99)
Potassium: 3.3 mmol/L — ABNORMAL LOW (ref 3.5–5.1)
Sodium: 134 mmol/L — ABNORMAL LOW (ref 135–145)
Total Bilirubin: 0.7 mg/dL (ref 0.0–1.2)
Total Protein: 7.9 g/dL (ref 6.5–8.1)

## 2024-12-04 LAB — HCG, SERUM, QUALITATIVE: Preg, Serum: NEGATIVE

## 2024-12-04 LAB — LIPASE, BLOOD: Lipase: 30 U/L (ref 11–51)

## 2024-12-04 MED ORDER — LACTATED RINGERS IV BOLUS
1000.0000 mL | Freq: Once | INTRAVENOUS | Status: AC
  Administered 2024-12-04: 1000 mL via INTRAVENOUS

## 2024-12-04 MED ORDER — ONDANSETRON HCL 4 MG PO TABS: 4.0000 mg | ORAL_TABLET | Freq: Three times a day (TID) | ORAL | 0 refills | Status: AC | PRN

## 2024-12-04 MED ORDER — ONDANSETRON HCL 4 MG/2ML IJ SOLN
4.0000 mg | Freq: Once | INTRAMUSCULAR | Status: AC
  Administered 2024-12-04: 4 mg via INTRAVENOUS
  Filled 2024-12-04: qty 2

## 2024-12-04 NOTE — ED Provider Notes (Signed)
" °  Physical Exam  BP (!) 162/88 (BP Location: Right Arm)   Pulse 68   Temp 99 F (37.2 C) (Oral)   Resp 18   Ht 5' 3 (1.6 m)   Wt 59 kg   SpO2 99%   BMI 23.03 kg/m   Physical Exam Vitals and nursing note reviewed.  HENT:     Head: Normocephalic and atraumatic.  Eyes:     Pupils: Pupils are equal, round, and reactive to light.  Cardiovascular:     Rate and Rhythm: Normal rate and regular rhythm.  Pulmonary:     Effort: Pulmonary effort is normal.     Breath sounds: Normal breath sounds.  Abdominal:     Palpations: Abdomen is soft.     Tenderness: There is no abdominal tenderness.  Skin:    General: Skin is warm and dry.  Neurological:     Mental Status: She is alert.  Psychiatric:        Mood and Affect: Mood normal.     Procedures  Procedures  ED Course / MDM   Clinical Course as of 12/04/24 1726  Fri Dec 04, 2024  1724 Patient reports symptomatic improvement after fluids and Zofran  here.  Pregnancy negative.  UA negative.  She is on her menstrual period currently which explains hematuria.  Will discharge with Zofran  and instructions on symptomatic management of viral GI illness [MP]    Clinical Course User Index [MP] Pamella Ozell LABOR, DO   Medical Decision Making I, Ozell Pamella DO, have assumed care of this patient from the previous provider pending UA, reevaluation p.o. trial  Amount and/or Complexity of Data Reviewed Labs: ordered.  Risk Prescription drug management.          Pamella Ozell LABOR, DO 12/04/24 1726  "

## 2024-12-04 NOTE — ED Notes (Signed)
 Patient called out by call bell. Secretary answered call bell next to this RN and said How can I help you? Patient very upset by this response. Patient states This is not how you talk to sick patients. This RN went into room. Patient asked this RN for secretary's last name. This RN informed patient that we don't give out staff last names. Patient states upset with care received.

## 2024-12-04 NOTE — ED Notes (Signed)
 Patient eloped prior to receiving discharge instructions from RN. This RN did not see patient leave department.

## 2024-12-04 NOTE — ED Triage Notes (Signed)
 Arrives POV with complaints of worsening nausea/vomiting, diarrhea, with abdominal pain for 3 days. Patient does report a history of chronic GI issues.

## 2024-12-04 NOTE — ED Notes (Signed)
 Pt was instructed to change to gown, pt compliant. Requested warm blankets.

## 2024-12-04 NOTE — ED Notes (Signed)
 Ice chips given to patient per request.

## 2024-12-04 NOTE — Discharge Instructions (Signed)
 You were seen in the Emergency Department for abdominal pain nausea and vomiting Your symptoms improved after fluids and Zofran  Your blood work looked okay Your potassium was a little low Your pregnancy test was negative There is no urinary tract infection This is likely a viral GI illness We have called in a prescription for Zofran  for you to pick up in your pharmacy begin taking as directed for nausea and vomiting Keep well-hydrated Return to the Emergency Department for severe abdominal pain if you are unable to eat or drink or have any other concerns Otherwise follow-up with your primary doctor regarding your symptoms as well as a recheck of your blood pressure

## 2024-12-04 NOTE — ED Provider Notes (Signed)
 " [place] EMERGENCY DEPARTMENT AT [facility] Provider Note   CSN: [id number] Arrival date & time: 12/04/24  1207     Patient presents with: Emesis, Diarrhea, and Abdominal Pain   Holly Hutchinson is a 43 y.o. female.    Emesis Associated symptoms: abdominal pain and diarrhea   Diarrhea Associated symptoms: abdominal pain and vomiting   Abdominal Pain Associated symptoms: diarrhea and vomiting   Patient presents with about 3 days of abdominal pain nausea vomiting diarrhea.  Decreased appetite.  States hard time keeping fluids down.  States that she had a shake today that she vomited up.  States that her daughter had similar symptoms but she is feeling better now.  No fevers.     Prior to Admission medications  Not on File    Allergies: Erythromycin    Review of Systems  Gastrointestinal:  Positive for abdominal pain, diarrhea and vomiting.    Updated Vital Signs BP (!) 169/96   Pulse 63   Temp 98.1 F (36.7 C) (Oral)   Resp 20   Ht 5' 3 (1.6 m)   Wt 59 kg   SpO2 98%   BMI 23.03 kg/m   Physical Exam Vitals and nursing note reviewed.  Cardiovascular:     Rate and Rhythm: Regular rhythm.  Abdominal:     Tenderness: There is abdominal tenderness.     Comments: Mild diffuse abdominal tenderness no rebound or guarding.  No hernia palpated.  No distention.  Skin:    Capillary Refill: Capillary refill takes less than 2 seconds.  Neurological:     Mental Status: She is alert and oriented to person, place, and time.     (all labs ordered are listed, but only abnormal results are displayed) Labs Reviewed  COMPREHENSIVE METABOLIC PANEL WITH GFR - Abnormal; Notable for the following components:      Result Value   Sodium 134 (*)    Potassium 3.3 (*)    Chloride 96 (*)    Glucose, Bld 111 (*)    AST 68 (*)    Anion gap 16 (*)    All other components within normal limits  CBC - Abnormal; Notable for the following components:   MCV 78.5 (*)     All other components within normal limits  LIPASE, BLOOD  HCG, SERUM, QUALITATIVE  URINALYSIS, ROUTINE W REFLEX MICROSCOPIC    EKG: None  Radiology: No results found.   Procedures   Medications Ordered in the ED  ondansetron  (ZOFRAN ) injection 4 mg (4 mg Intravenous Given 12/04/24 1421)  lactated ringers  bolus 1,000 mL (1,000 mLs Intravenous New Bag/Given 12/04/24 1419)                                    Medical Decision Making Amount and/or Complexity of Data Reviewed Labs: ordered.  Risk Prescription drug management.   Patient with nausea vomiting diarrhea.  Abdominal pain is mild.  Differential diagnose includes various causes but most likely gastroenteritis.  Obstruction felt less likely.  Blood work overall reassuring with some dehydration.  Will give fluid bolus and antiemetics.  Hopefully it feels better should be able to discharge home.  If not reexamine and consider imaging.  Patient had 1 L fluid.  Still feel somewhat dehydrated.  Will give another liter.  Care will be turned over to Dr. Pamella     Final diagnoses:  Nausea vomiting and diarrhea  ED Discharge Orders     None          Patsey Lot, MD 12/04/24 1459  "

## 2024-12-04 NOTE — ED Notes (Signed)
 Patient's IVF finished. Patient unhooked and assisted to restroom at this time.

## 2024-12-04 NOTE — ED Notes (Signed)
 Patient aware of need for urine sample. Patient instructed to hit call bell when able to provide sample. Lights dimmed for patient comfort.

## 2024-12-23 ENCOUNTER — Ambulatory Visit: Admitting: Dermatology

## 2024-12-23 ENCOUNTER — Encounter: Payer: Self-pay | Admitting: Dermatology

## 2024-12-23 VITALS — BP 129/83 | HR 79 | Temp 98.2°F

## 2024-12-23 DIAGNOSIS — R229 Localized swelling, mass and lump, unspecified: Secondary | ICD-10-CM

## 2024-12-23 DIAGNOSIS — M7989 Other specified soft tissue disorders: Secondary | ICD-10-CM | POA: Diagnosis not present

## 2024-12-23 NOTE — Patient Instructions (Addendum)
 [facility]- [phone]  Important Information  Due to recent changes in healthcare laws, you may see results of your pathology and/or laboratory studies on MyChart before the doctors have had a chance to review them. We understand that in some cases there may be results that are confusing or concerning to you. Please understand that not all results are received at the same time and often the doctors may need to interpret multiple results in order to provide you with the best plan of care or course of treatment. Therefore, we ask that you please give us  2 business days to thoroughly review all your results before contacting the office for clarification. Should we see a critical lab result, you will be contacted sooner.   If You Need Anything After Your Visit  If you have any questions or concerns for your doctor, please call our main line at [phone] If no one answers, please leave a voicemail as directed and we will return your call as soon as possible. Messages left after 4 pm will be answered the following business day.   You may also send us  a message via MyChart. We typically respond to MyChart messages within 1-2 business days.  For prescription refills, please ask your pharmacy to contact our office. Our fax number is [phone].  If you have an urgent issue when the clinic is closed that cannot wait until the next business day, you can page your doctor at the number below.    Please note that while we do our best to be available for urgent issues outside of office hours, we are not available 24/7.   If you have an urgent issue and are unable to reach us , you may choose to seek medical care at your doctor's office, retail clinic, urgent care center, or emergency room.  If you have a medical emergency, please immediately call 911 or go to the emergency department. In the event of inclement weather, please call our main line at [phone] for an update on the  status of any delays or closures.  Dermatology Medication Tips: Please keep the boxes that topical medications come in in order to help keep track of the instructions about where and how to use these. Pharmacies typically print the medication instructions only on the boxes and not directly on the medication tubes.   If your medication is too expensive, please contact our office at [phone] or send us  a message through MyChart.   We are unable to tell what your co-pay for medications will be in advance as this is different depending on your insurance coverage. However, we may be able to find a substitute medication at lower cost or fill out paperwork to get insurance to cover a needed medication.   If a prior authorization is required to get your medication covered by your insurance company, please allow us  1-2 business days to complete this process.  Drug prices often vary depending on where the prescription is filled and some pharmacies may offer cheaper prices.  The website www.goodrx.com contains coupons for medications through different pharmacies. The prices here do not account for what the cost may be with help from insurance (it may be cheaper with your insurance), but the website can give you the price if you did not use any insurance.  - You can print the associated coupon and take it with your prescription to the pharmacy.  - You may also stop by our office during regular business hours and pick up a GoodRx  coupon card.  - If you need your prescription sent electronically to a different pharmacy, notify our office through [facility] or by phone at [phone]

## 2024-12-23 NOTE — Progress Notes (Signed)
" ° °  Follow-Up Visit   Subjective  Holly Hutchinson is a 44 y.o. female who presents for the following: Excision for Soft Subcutaneous nodule on the Left Arm.   The following portions of the chart were reviewed this encounter and updated as appropriate: medications, allergies, medical history  Review of Systems:  No other skin or systemic complaints except as noted in HPI or Assessment and Plan.  Pt reports lesion has been present for 3 year and denies previous treatment. Pt denies pain and itchiness. Lesion has increased in size, pt states sometimes its hard and can be soft.   Pt denies personal history of skin cancer. Pt denies family history of skin cancer.   Objective  Well appearing patient in no apparent distress; mood and affect are within normal limits.  A focused examination was performed of the following areas: Left Arm Relevant physical exam findings are noted in the Assessment and Plan.         Assessment & Plan   Subcutaneous Nodule 9.0cm x 8.5cm Exam: left upper arm  Treatment Plan: Referral to Dr. Montorfano for consult and possible removal.   The patient has a subcutaneous mass of the left arm that was previously evaluated with ultrasound, which demonstrated nonspecific features and poor visualization, suggesting the lesion is likely located deep within the soft tissues. Given the depth and indistinct imaging characteristics, it was discussed that if she elects to proceed with removal, excision would be most appropriately performed in the operating room setting with either Plastic Surgery or General Surgery. The risks of surgical excision were reviewed in detail, including but not limited to potential nerve injury, postoperative scarring, bleeding, infection, and the possibility of incomplete excision. After discussion of management options, referral was recommended to Dr. Montorfano with [facility] for further evaluation and surgical management. A referral  was placed accordingly.  Return if symptoms worsen or fail to improve.  LILLETTE Virgle Boards, Mohs/Dermatology Tech am acting as a neurosurgeon for RUFUS CHRISTELLA HOLY, MD.   Documentation: I have reviewed the above documentation for accuracy and completeness, and I agree with the above.  RUFUS CHRISTELLA HOLY, MD  "

## 2024-12-30 ENCOUNTER — Ambulatory Visit (INDEPENDENT_AMBULATORY_CARE_PROVIDER_SITE_OTHER)

## 2024-12-30 VITALS — BP 132/97 | HR 91 | Ht 63.0 in | Wt 124.6 lb

## 2024-12-30 DIAGNOSIS — R2232 Localized swelling, mass and lump, left upper limb: Secondary | ICD-10-CM | POA: Diagnosis not present

## 2024-12-30 NOTE — Progress Notes (Signed)
 NAME: Holly Hutchinson  MRN: [id number]  DOB: May 23, 1981   Referring physician: Woodie Holy MD  PCP: Rudy Carlin LABOR, MD   CHIEF COMPLAINT: Soft tissue mass of the left arm  HPI:  Holly Hutchinson is a 44 y.o. year old female who presents with a soft tissue mass that is non painful, soft and has enlarged over the last years slowly. No episodes of infection noted. Had an ultrasound done that showed a possible lipoma, yet borders were not well defined. Denies smoking.   PMH: Past Medical History:  Diagnosis Date   Bronchitis    PSH:  Past Surgical History:  Procedure Laterality Date   BREAST BIOPSY Left 04/2022   ECTOPIC PREGNANCY SURGERY       MEDICATIONS:  Current Medications[1]   ALLERGIES:  is allergic to erythromycin.   FAMILY HISTORY:  Family History  Problem Relation Age of Onset   Asthma Mother    Breast cancer Maternal Grandmother        73s   Bladder Cancer Neg Hx    Uterine cancer Neg Hx    Renal cancer Neg Hx       VITALS:  Vitals:   12/30/24 0853  BP: (!) 132/97  Pulse: 91  SpO2: 99%    Constitutional: Good color, good hydration. VSS. Head and Neck: No lymphadenopathy, thyromegaly or masses  Chest: Normal breathing, Normal shape and excursion.  Cor: RRR  Left arm: Subcutaneous mass measures 5 cm x 6 cm. Currently not infected.  Mobile and not attached to underlying structures.  No basin lymphadenopathy, satellite lesions or in-transit lesions.  ASSESSMENT/PLAN  Assessment & Plan   Pt. presents with a subcutaneous mass most representative of lipoma  Today we discussed the risks, benefits and alternatives to mass excision and possible tissue rearrangement. We discussed the alternatives which include continued observation; however, I told the patient that I do not believe this mass will resolve on its own. We then discussed the benefits of surgical excision which include complete removal of the lesion. We discussed the  risks of excision which include seroma, hematoma, infection, bleeding, damage to surrounding healthy tissue, damage to surrounding structures and need for further surgery. We also discussed the risks of hair loss, wound separation and recurrence of the mass. We discussed scar patterns of tissue rearrangement, if needed.  I explained that the mass will be sent to pathology and if it were to be malignant further surgery may be needed. We discussed the risks of anesthesia which will be further elaborated on by our anesthesia colleagues. The patient has a good understanding of all the risks and benefits, postoperative course and care. We obtained pictures. All questions were answered.    MRI of left arm first. Then, granted that this is a lipoma, I recommended excision in the operating room. Risks, benefits and limitations were discussed. Procedure will be precertified and scheduled.   Kiegan Macaraeg M. Jaekwon Mcclune, MD [facility]      [1] No current outpatient medications on file.

## 2025-01-07 ENCOUNTER — Ambulatory Visit (HOSPITAL_COMMUNITY)
Admission: RE | Admit: 2025-01-07 | Discharge: 2025-01-07 | Disposition: A | Discharge status: Home / Self Care | Source: Ambulatory Visit

## 2025-01-07 DIAGNOSIS — R2232 Localized swelling, mass and lump, left upper limb: Secondary | ICD-10-CM | POA: Diagnosis present

## 2025-01-07 MED ORDER — GADOBUTROL 1 MMOL/ML IV SOLN
5.5000 mL | Freq: Once | INTRAVENOUS | Status: AC | PRN
  Administered 2025-01-07: 5.5 mL via INTRAVENOUS

## 2025-01-25 ENCOUNTER — Encounter: Admitting: Student

## 2025-02-09 ENCOUNTER — Encounter (HOSPITAL_BASED_OUTPATIENT_CLINIC_OR_DEPARTMENT_OTHER): Payer: Self-pay

## 2025-02-09 ENCOUNTER — Ambulatory Visit (HOSPITAL_BASED_OUTPATIENT_CLINIC_OR_DEPARTMENT_OTHER): Admit: 2025-02-09

## 2025-02-17 ENCOUNTER — Encounter

## 2025-03-05 ENCOUNTER — Encounter

## 2025-05-05 ENCOUNTER — Encounter
